# Patient Record
Sex: Male | Born: 1975 | Race: White | Hispanic: No | Marital: Married | Smoking: Former smoker
Health system: Southern US, Community
[De-identification: ages and names within clinical notes are randomized; demographics above are authoritative.]

## PROBLEM LIST (undated history)

## (undated) DIAGNOSIS — F419 Anxiety disorder, unspecified: Secondary | ICD-10-CM

## (undated) DIAGNOSIS — Q221 Congenital pulmonary valve stenosis: Secondary | ICD-10-CM

## (undated) DIAGNOSIS — I255 Ischemic cardiomyopathy: Secondary | ICD-10-CM

## (undated) DIAGNOSIS — Z630 Problems in relationship with spouse or partner: Secondary | ICD-10-CM

## (undated) DIAGNOSIS — I10 Essential (primary) hypertension: Secondary | ICD-10-CM

## (undated) DIAGNOSIS — I493 Ventricular premature depolarization: Secondary | ICD-10-CM

## (undated) DIAGNOSIS — Z87891 Personal history of nicotine dependence: Secondary | ICD-10-CM

## (undated) DIAGNOSIS — R011 Cardiac murmur, unspecified: Secondary | ICD-10-CM

## (undated) DIAGNOSIS — F32A Depression, unspecified: Secondary | ICD-10-CM

## (undated) DIAGNOSIS — I2510 Atherosclerotic heart disease of native coronary artery without angina pectoris: Secondary | ICD-10-CM

## (undated) DIAGNOSIS — R079 Chest pain, unspecified: Secondary | ICD-10-CM

## (undated) HISTORY — DX: Problems in relationship with spouse or partner: Z63.0

## (undated) HISTORY — PX: OTHER SURGICAL HISTORY: SHX169

## (undated) HISTORY — DX: Congenital pulmonary valve stenosis: Q22.1

## (undated) HISTORY — PX: ANKLE SURGERY: SHX546

## (undated) HISTORY — DX: Chest pain, unspecified: R07.9

## (undated) HISTORY — DX: Personal history of nicotine dependence: Z87.891

## (undated) HISTORY — DX: Anxiety disorder, unspecified: F41.9

## (undated) HISTORY — DX: Ventricular premature depolarization: I49.3

## (undated) HISTORY — PX: HAND SURGERY: SHX662

## (undated) HISTORY — DX: Depression, unspecified: F32.A

## (undated) HISTORY — PX: INGUINAL HERNIA REPAIR: SHX194

## (undated) HISTORY — DX: Ischemic cardiomyopathy: I25.5

## (undated) HISTORY — DX: Atherosclerotic heart disease of native coronary artery without angina pectoris: I25.10

## (undated) HISTORY — DX: Cardiac murmur, unspecified: R01.1

---

## 2004-07-13 ENCOUNTER — Ambulatory Visit: Payer: Self-pay

## 2004-07-26 ENCOUNTER — Ambulatory Visit: Payer: Self-pay | Admitting: Cardiology

## 2008-11-17 ENCOUNTER — Telehealth (INDEPENDENT_AMBULATORY_CARE_PROVIDER_SITE_OTHER): Payer: Self-pay | Admitting: *Deleted

## 2009-01-24 ENCOUNTER — Telehealth (INDEPENDENT_AMBULATORY_CARE_PROVIDER_SITE_OTHER): Payer: Self-pay | Admitting: *Deleted

## 2009-02-13 ENCOUNTER — Telehealth (INDEPENDENT_AMBULATORY_CARE_PROVIDER_SITE_OTHER): Payer: Self-pay | Admitting: *Deleted

## 2011-02-04 ENCOUNTER — Telehealth: Payer: Self-pay | Admitting: Cardiology

## 2011-02-04 ENCOUNTER — Encounter: Payer: Self-pay | Admitting: *Deleted

## 2011-02-04 NOTE — Telephone Encounter (Signed)
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 He called c/o while raking leaves he developed a discomfort in his chest that radiated into his throat and left shoulder. He states he had a panic attack and then developed a headache. The discomfort went away when he stopped raking. He denies SOB or other symptoms since this episode. He occ smokes and has been under increased stress with his job. He is pain free at present. Will discuss with the DOD Deliah Goody

## 2011-02-04 NOTE — Telephone Encounter (Signed)
New problem Pt called and said he had some angina pain over the weekend. He would like to talk to you.

## 2011-02-04 NOTE — Telephone Encounter (Signed)
Paper chart and symptoms reviewed with dr ross(dod). Pt will come tomorrow at 2 pm to see dr wall Brandon Kemp

## 2011-02-05 ENCOUNTER — Ambulatory Visit (INDEPENDENT_AMBULATORY_CARE_PROVIDER_SITE_OTHER): Payer: Managed Care, Other (non HMO) | Admitting: Cardiology

## 2011-02-05 ENCOUNTER — Encounter: Payer: Self-pay | Admitting: Cardiology

## 2011-02-05 VITALS — BP 124/88 | HR 79 | Ht 71.0 in | Wt 213.0 lb

## 2011-02-05 DIAGNOSIS — Q2579 Other congenital malformations of pulmonary artery: Secondary | ICD-10-CM

## 2011-02-05 DIAGNOSIS — R0789 Other chest pain: Secondary | ICD-10-CM

## 2011-02-05 DIAGNOSIS — Q256 Stenosis of pulmonary artery: Secondary | ICD-10-CM

## 2011-02-05 DIAGNOSIS — Q221 Congenital pulmonary valve stenosis: Secondary | ICD-10-CM | POA: Insufficient documentation

## 2011-02-05 DIAGNOSIS — Q288 Other specified congenital malformations of circulatory system: Secondary | ICD-10-CM

## 2011-02-05 LAB — HEPATIC FUNCTION PANEL
ALT: 26 U/L (ref 0–53)
Bilirubin, Direct: 0.1 mg/dL (ref 0.0–0.3)
Total Bilirubin: 1.1 mg/dL (ref 0.3–1.2)
Total Protein: 6.8 g/dL (ref 6.0–8.3)

## 2011-02-05 LAB — LIPID PANEL
HDL: 45.4 mg/dL (ref >39.00)
Triglycerides: 151 mg/dL — ABNORMAL HIGH (ref 0.0–149.0)
VLDL: 30.2 mg/dL (ref 0.0–40.0)

## 2011-02-05 NOTE — Patient Instructions (Addendum)
Your physician recommends that you have lab work today cholesterol and liver panel.  We will call you with your results.  Your physician has requested that you have an echocardiogram. Echocardiography is a painless test that uses sound waves to create images of your heart. It provides your doctor with information about the size and shape of your heart and how well your heart's chambers and valves are working. This procedure takes approximately one hour. There are no restrictions for this procedure.

## 2011-02-05 NOTE — Assessment & Plan Note (Addendum)
This sounds very atypical for coronary or cardiac related discomfort. I do not feel this is related to his previous surgery as a child.  I reviewed the symptoms of angina with him. We'll check fasting lipids and LFTs. He is advised not  to smoke or to quit.

## 2011-02-05 NOTE — Assessment & Plan Note (Signed)
He is status post repair in  [place] At age 35. He is asymptomatic. Her EKG is abnormal with a suggestion of right ventricular hypertrophy. Will check 2-D echocardiogram structural followup. I do not feel is related to his chest discomfort.

## 2011-02-05 NOTE — Progress Notes (Signed)
HPI Brandon Kemp comes in today for her chest discomfort while raking leaves with his dad.  Scratches a well localized discomfort just medial to the left nipple. It was aching and then shooting in nature. It lasted for a few seconds. At one point it did  to the neck and jaw area. No other associated symptoms.  He denies any cough, hemoptysis, difficulty taking a deep breath, difficulty swallowing, or chest tightness or pressure with exertion.  He does smoke but not a lot. He is getting to get laid off and  is under a lot of stress.  Denies orthopnea, PND, edema, palpitations, presyncope, syncope, fever or chills.  Past Medical History  Diagnosis Date  . Pulmonary stenosis   . History of cardiac murmur     systolic    No current outpatient prescriptions on file.    No Active Allergies  Family History  Problem Relation Age of Onset  . Brandon Kemp Parkinson White syndrome Brother   . Hypertension Father     History   Social History  . Marital Status: Single    Spouse Name: N/A    Number of Children: N/A  . Years of Education: N/A   Occupational History  . Not on file.   Social History Main Topics  . Smoking status: Current Some Day Smoker  . Smokeless tobacco: Not on file   Comment: sometimes, when stressed about 2 cigarettes a day  . Alcohol Use: Yes     social  . Drug Use: No  . Sexually Active: Not on file   Other Topics Concern  . Not on file   Social History Narrative  . No narrative on file    ROS ALL NEGATIVE EXCEPT THOSE NOTED IN HPI  PE  General Appearance: well developed, well nourished in no acute distress HEENT: symmetrical face, PERRLA, good dentition  Neck: no JVD, thyromegaly, or adenopathy, trachea midline Chest: symmetric without deformity, no chest [name] tenderness Cardiac: PMI non-displaced, RRR, normal S1, S2, no gallop or murmur Lung: clear to ausculation and percussion Vascular: all pulses full without bruits  Abdominal: nondistended,  nontender, good bowel sounds, no HSM, no bruits Extremities: no cyanosis, clubbing or edema, no sign of DVT, no varicosities  Skin: normal color, no rashes Neuro: alert and oriented x 3, non-focal Pysch: normal affect  EKG In normal sinus rhythm right atrial enlargement, mild right ventricular hypertrophy BMET No results found for this basename: na, k, cl, co2, glucose, bun, creatinine, calcium, gfrnonaa, gfraa    Lipid Panel  No results found for this basename: chol, trig, hdl, cholhdl, vldl, ldlcalc    CBC No results found for this basename: wbc, rbc, hgb, hct, plt, mcv, mch, mchc, rdw, neutrabs, lymphsabs, monoabs, eosabs, basosabs

## 2011-02-06 ENCOUNTER — Ambulatory Visit (HOSPITAL_COMMUNITY): Payer: Managed Care, Other (non HMO) | Attending: Cardiovascular Disease | Admitting: Radiology

## 2011-02-06 DIAGNOSIS — Q256 Stenosis of pulmonary artery: Secondary | ICD-10-CM

## 2011-02-06 DIAGNOSIS — F17200 Nicotine dependence, unspecified, uncomplicated: Secondary | ICD-10-CM | POA: Insufficient documentation

## 2011-02-06 DIAGNOSIS — R072 Precordial pain: Secondary | ICD-10-CM | POA: Insufficient documentation

## 2011-02-06 LAB — LDL CHOLESTEROL, DIRECT: Direct LDL: 183.7 mg/dL

## 2011-02-07 ENCOUNTER — Telehealth: Payer: Self-pay | Admitting: *Deleted

## 2011-02-07 ENCOUNTER — Encounter: Payer: Self-pay | Admitting: *Deleted

## 2011-02-07 DIAGNOSIS — E785 Hyperlipidemia, unspecified: Secondary | ICD-10-CM

## 2011-02-07 NOTE — Telephone Encounter (Signed)
Pt aware of echo and cholesterol results. Will work on exercising and reducing saturated fats and follow a more heart healthy diet. Recommended AHA web site. Pt receptive. Repeat lipid, liver in 6 months per Dr. Daleen Squibb. Mylo Red RN

## 2011-02-07 NOTE — Telephone Encounter (Signed)
Message copied by Theda Belfast on Thu Feb 07, 2011 10:39 AM ------      Message from: Valera Castle C      Created: Thu Feb 07, 2011  9:40 AM       He needs to decrease saturated fat. LDL goal less then 160. Begin exercise to increase HDL. Repeat labs in 6 months to a year for possible drug therapy if this fails.

## 2011-05-24 ENCOUNTER — Telehealth: Payer: Self-pay | Admitting: Cardiology

## 2011-05-24 NOTE — Telephone Encounter (Signed)
Pt calls today re lab work due in June- fasting lipid and liver.  Also, would like Dr. 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 He would very much appreciate it if  Dr. Daleen Squibb were to give him a call at his convenience.   He would like Dr. Vern Claude opinion regarding interventional device contacts. Pt can be reached at [phone].   Mylo Red RN

## 2011-05-24 NOTE — Telephone Encounter (Signed)
New msg Pt wants to talk to you about something personal. Please call

## 2011-09-26 ENCOUNTER — Encounter: Payer: Self-pay | Admitting: Nurse Practitioner

## 2011-09-26 ENCOUNTER — Ambulatory Visit (INDEPENDENT_AMBULATORY_CARE_PROVIDER_SITE_OTHER): Payer: BC Managed Care – PPO | Admitting: Nurse Practitioner

## 2011-09-26 VITALS — BP 134/88 | HR 68 | Ht 71.0 in | Wt 219.0 lb

## 2011-09-26 DIAGNOSIS — I4949 Other premature depolarization: Secondary | ICD-10-CM

## 2011-09-26 DIAGNOSIS — E785 Hyperlipidemia, unspecified: Secondary | ICD-10-CM

## 2011-09-26 DIAGNOSIS — R002 Palpitations: Secondary | ICD-10-CM

## 2011-09-26 DIAGNOSIS — I493 Ventricular premature depolarization: Secondary | ICD-10-CM | POA: Insufficient documentation

## 2011-09-26 LAB — TSH: TSH: 1.36 u[IU]/mL (ref 0.35–5.50)

## 2011-09-26 LAB — LIPID PANEL
HDL: 43 mg/dL (ref >39.00)
Triglycerides: 120 mg/dL (ref 0.0–149.0)

## 2011-09-26 NOTE — Progress Notes (Signed)
Patient Name: Brandon Kemp Date of Encounter: 09/26/2011  Primary Care Provider:  Dwana Melena, MD Primary Cardiologist:  T. Wall, MD  Patient Profile  36 y/o male with h/o congenital pulmonic stenosis s/p repair who presents for f/u of c/p and palpitations.  Problem List   Past Medical History  Diagnosis Date  . Congenital pulmonic valve stenosis     a. s/p surgical repair @ Age 8 in [place];  b.  12/12  Echo: EF 55-60%, nl wall motion, mild LVH, moderate PR.  . Chest pain     a. 09/2011 ETT @ [facility]: Walked 13 mins, rare PVC's, couplet (symptomatic), no acute st/t changes.  . History of tobacco abuse   . Symptomatic PVCs     a. 09/2011   Past Surgical History  Procedure Date  . Pulmonary valvotomy age 48  . Pulmonary outflow patch age 48  . Hand surgery   . Ankle surgery     Allergies  No Known Allergies  HPI  36 y/o male with the above problem list.  He was last seen in the office in Dec 2012.  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  He had mild chest pain associated with this but no dyspnea or presyncope.  He was taken to the ED by IR staff and there he w/u was unrevealing, though his bp was elevated into the 170's per his report.  He saw cardiology and underwent ETT the next day as an outpt, which was normal as outlined above.  He did have rare PVC's during the ETT, which were symptomatic.  Since, he has had no recurrence of palpitations.  He has been watching his diet a little more closely and also checking his BP, noting that it has been running in the 130's.  Home Medications  Prior to Admission medications   Not on File    Review of Systems  Palpitations as above.  No chest pain, sob, n, v, dizziness, syncope, edema, early satiety, dysuria, dark stools, blood in stools, diarrhea, rash/skin changes, fevers, chills, wt loss/gain.   Otherwise all systems reviewed and negative.  Physical Exam  Blood pressure 134/88, pulse 68, height 5\' 11"  (1.803 m), weight 219 lb (99.338 kg).  General: Pleasant, NAD Psych: Normal affect. Neuro: Alert and oriented X 3. Moves all extremities spontaneously. HEENT: Normal  Neck: Supple without bruits or JVD. Lungs:  Resp regular and unlabored, CTA. Heart: RRR no s3, s4, or murmurs. Abdomen: Soft, non-tender, non-distended, BS + x 4.  Extremities: No clubbing, cyanosis or edema. DP/PT/Radials 2+ and equal bilaterally.  Accessory Clinical Findings  ECG - rsr, 68, rvh  Assessment & Plan  1.  Chest pain & symptomatic PVC's:  Resolved.  He believes he had a full battery of blood work in the ER, which was unrevealing but doesn't think he had a TSH.  We will check this today.  Given no recurrence of Ss, would not place on a BB @ this time but would consider this if he becomes more symptomatic in the future.  He showed excellent exercise tolerance on ETT and he does not require additional ischemic evaluation @ this time.  2.  Congenital Pulmonic Stenosis:  Last echo in 12/12 showed moderate PR.  Plan to f/u echo in December.  3.  HTN:  bp was high in ED but relatively  stable in the 130's since.  Rec continued following of bp @ home with ongoing lifestyle changes.  4.  HL:  Pt had nonfasting lipid profile in December with markedly elevated lipids.  Will repeat fasting profile today.  If high, would again rec aggressive lifestyle changes before meds, as he endorses a terrible diet.  Nicolasa Ducking, NP 09/26/2011, 10:29 AM

## 2011-09-26 NOTE — Patient Instructions (Addendum)
Please have blood work done today.  We will call you with these results. TSH;FLP  Your physician wants you to follow-up in: 5 months with Dr. Daleen Squibb.   You will receive a reminder letter in the mail two months in advance. If you don't receive a letter, please call our office to schedule the follow-up appointment.

## 2011-09-27 ENCOUNTER — Telehealth: Payer: Self-pay | Admitting: *Deleted

## 2011-09-27 NOTE — Telephone Encounter (Signed)
Message copied by Awilda Bill on Fri Sep 27, 2011  9:58 AM ------      Message from: Nicolasa Ducking R      Created: Thu Sep 26, 2011  5:52 PM       Fasting lipids look better than last years nonfasting.  TC and LDL still elevated (220/155) but at this point, given age, would rec aggressive lifestyle changes, i.e. Regular exercise and low fat/low cholesterol diet.  Repeat fasting lipids in 6 months.

## 2011-09-27 NOTE — Telephone Encounter (Signed)
Message copied by Awilda Bill on Fri Sep 27, 2011  9:51 AM ------      Message from: Nicolasa Ducking R      Created: Thu Sep 26, 2011  5:52 PM       Fasting lipids look better than last years nonfasting.  TC and LDL still elevated (220/155) but at this point, given age, would rec aggressive lifestyle changes, i.e. Regular exercise and low fat/low cholesterol diet.  Repeat fasting lipids in 6 months.

## 2011-09-27 NOTE — Telephone Encounter (Signed)
Spoke to patient regarding lab results.  Patient had questions about whether or not this was an indication for his headaches.  Not typical for this.  Pt is following up with his primary care, and will monitor his diet and try to get more exercise.  Vista Mink, CMA

## 2011-12-26 ENCOUNTER — Encounter (HOSPITAL_COMMUNITY): Payer: Self-pay | Admitting: Emergency Medicine

## 2011-12-26 ENCOUNTER — Emergency Department (HOSPITAL_COMMUNITY)
Admission: EM | Admit: 2011-12-26 | Discharge: 2011-12-27 | Disposition: A | Discharge status: Home / Self Care | Payer: Private Health Insurance - Indemnity | Attending: Emergency Medicine | Admitting: Emergency Medicine

## 2011-12-26 ENCOUNTER — Emergency Department (HOSPITAL_COMMUNITY): Payer: Private Health Insurance - Indemnity

## 2011-12-26 DIAGNOSIS — R0789 Other chest pain: Secondary | ICD-10-CM

## 2011-12-26 DIAGNOSIS — I4949 Other premature depolarization: Secondary | ICD-10-CM | POA: Insufficient documentation

## 2011-12-26 DIAGNOSIS — Z87891 Personal history of nicotine dependence: Secondary | ICD-10-CM | POA: Insufficient documentation

## 2011-12-26 DIAGNOSIS — N289 Disorder of kidney and ureter, unspecified: Secondary | ICD-10-CM | POA: Insufficient documentation

## 2011-12-26 DIAGNOSIS — Q221 Congenital pulmonary valve stenosis: Secondary | ICD-10-CM | POA: Insufficient documentation

## 2011-12-26 LAB — URINALYSIS, ROUTINE W REFLEX MICROSCOPIC
Glucose, UA: NEGATIVE mg/dL
Leukocytes, UA: NEGATIVE
Nitrite: NEGATIVE
Protein, ur: NEGATIVE mg/dL
Urobilinogen, UA: 0.2 mg/dL (ref 0.0–1.0)

## 2011-12-26 LAB — CBC WITH DIFFERENTIAL/PLATELET
Basophils Absolute: 0 10*3/uL (ref 0.0–0.1)
HCT: 44.9 % (ref 39.0–52.0)
Lymphocytes Relative: 35 % (ref 12–46)
Monocytes Absolute: 0.6 10*3/uL (ref 0.1–1.0)
Neutro Abs: 4 10*3/uL (ref 1.7–7.7)
RDW: 12.1 % (ref 11.5–15.5)
WBC: 7.3 10*3/uL (ref 4.0–10.5)

## 2011-12-26 LAB — POCT I-STAT TROPONIN I: Troponin i, poc: 0.01 ng/mL (ref 0.00–0.08)

## 2011-12-26 LAB — COMPREHENSIVE METABOLIC PANEL
Alkaline Phosphatase: 58 U/L (ref 39–117)
BUN: 18 mg/dL (ref 6–23)
Calcium: 10 mg/dL (ref 8.4–10.5)
GFR calc Af Amer: 47 mL/min — ABNORMAL LOW (ref >90)
Glucose, Bld: 118 mg/dL — ABNORMAL HIGH (ref 70–99)
Total Protein: 7.2 g/dL (ref 6.0–8.3)

## 2011-12-26 MED ORDER — SODIUM CHLORIDE 0.9 % IV BOLUS (SEPSIS)
1000.0000 mL | Freq: Once | INTRAVENOUS | Status: AC
  Administered 2011-12-26: 1000 mL via INTRAVENOUS

## 2011-12-26 MED ORDER — ALUM & MAG HYDROXIDE-SIMETH 200-200-20 MG/5ML PO SUSP
30.0000 mL | Freq: Once | ORAL | Status: AC
  Administered 2011-12-26: 30 mL via ORAL
  Filled 2011-12-26: qty 30

## 2011-12-26 NOTE — ED Notes (Signed)
PT. REPORTS MID CHEST PRESSURE / PALPITATIONS " DISCOMFORT" ONSET YESTERDAY , STATES " STRESSED OUT " FOR THE PAST SEVERAL DAYS .

## 2011-12-26 NOTE — ED Provider Notes (Signed)
History     CSN: [id number]  Arrival date & time 12/26/11  2122   First MD Initiated Contact with Patient 12/26/11 2146      Chief Complaint  Patient presents with  . Chest Pain    (Consider location/radiation/quality/duration/timing/severity/associated sxs/prior treatment) Patient is a 36 y.o. male presenting with chest pain. The history is provided by the patient. No language interpreter was used.  Chest Pain The chest pain began 3 - 5 days ago. Duration of episode(s) is 1 minute. Chest pain occurs intermittently. The chest pain is resolved. At its most intense, the pain is at 3/10. The pain is currently at 0/10. The severity of the pain is mild. The quality of the pain is described as tightness. The pain does not radiate. Chest pain is worsened by stress. Primary symptoms include palpitations (intermittent for several months). Pertinent negatives for primary symptoms include no shortness of breath, no cough, no abdominal pain, no nausea, no vomiting and no dizziness.  The palpitations did not occur with dizziness or shortness of breath.  Pertinent negatives for associated symptoms include no diaphoresis, no lower extremity edema and no near-syncope. He tried nothing for the symptoms. Risk factors include male gender.  His past medical history is significant for hypertension.  Pertinent negatives for past medical history include no CAD, no cancer, no MI and no PE.  Pertinent negatives for family medical history include: no early MI in family.  Procedure history is positive for echocardiogram and stress echo (neg in July '13).  Procedure history is negative for cardiac catheterization.     Past Medical History  Diagnosis Date  . Congenital pulmonic valve stenosis     a. s/p surgical repair @ Age 16 in [place];  b.  12/12  Echo: EF 55-60%, nl wall motion, mild LVH, moderate PR.  . Chest pain     a. 09/2011 ETT @ [facility]: Walked 13 mins, rare PVC's, couplet (symptomatic), no acute st/t  changes.  . History of tobacco abuse   . Symptomatic PVCs     a. 09/2011    Past Surgical History  Procedure Date  . Pulmonary valvotomy age 75  . Pulmonary outflow patch age 75  . Hand surgery   . Ankle surgery     Family History  Problem Relation Age of Onset  . Evelene Croon Parkinson White syndrome Brother   . Hypertension Father     History  Substance Use Topics  . Smoking status: Former Games developer  . Smokeless tobacco: Not on file   Comment: sometimes, when stressed about 2 cigarettes a day  . Alcohol Use: Yes     social      Review of Systems  Constitutional: Negative for diaphoresis, activity change and appetite change.  HENT: Negative for sore throat and neck pain.   Eyes: Negative for discharge and visual disturbance.  Respiratory: Negative for cough, choking and shortness of breath.   Cardiovascular: Positive for chest pain and palpitations (intermittent for several months). Negative for leg swelling and near-syncope.  Gastrointestinal: Negative for nausea, vomiting, abdominal pain, diarrhea and constipation.  Genitourinary: Negative for dysuria and difficulty urinating.  Musculoskeletal: Negative for back pain and arthralgias.  Skin: Negative for color change and pallor.  Neurological: Negative for dizziness, speech difficulty and light-headedness.  Psychiatric/Behavioral: Negative for behavioral problems and agitation.  All other systems reviewed and are negative.    Allergies  Review of patient's allergies indicates no known allergies.  Home Medications  No current outpatient prescriptions on file.  BP 163/100  Pulse 73  Temp 98.4 F (36.9 C) (Oral)  Resp 20  SpO2 99%  Physical Exam  Constitutional: He appears well-developed. No distress.  HENT:  Head: Normocephalic and atraumatic.  Mouth/Throat: No oropharyngeal exudate.  Eyes: EOM are normal. Pupils are equal, round, and reactive to light. Right eye exhibits no discharge. Left eye exhibits no  discharge.  Neck: Normal range of motion. Neck supple. No JVD present.  Cardiovascular: Normal rate, regular rhythm and normal heart sounds.   Pulmonary/Chest: Effort normal and breath sounds normal. No stridor. No respiratory distress. He has no wheezes. He has no rales. He exhibits no tenderness.  Abdominal: Soft. Bowel sounds are normal. There is no tenderness. There is no guarding.  Genitourinary: Penis normal.  Musculoskeletal: Normal range of motion. He exhibits no edema and no tenderness.  Neurological: He is alert. No cranial nerve deficit. He exhibits normal muscle tone.  Skin: Skin is warm and dry. He is not diaphoretic. No erythema. No pallor.  Psychiatric: He has a normal mood and affect. His behavior is normal. Judgment and thought content normal.    ED Course  Procedures (including critical care time)   Labs Reviewed  COMPREHENSIVE METABOLIC PANEL  CBC WITH DIFFERENTIAL   No results found.   No diagnosis found.    MDM   Comprehensive metabolic panel (Final result)  Abnormal  Component (Lab Inquiry)      Result Time  NA  K  CL  CO2  GLUCOSE    12/26/11 2230  137  3.9  98  29  118 (H)           Result Time  BUN  Creatinine, Ser  CALCIUM  PROTEIN  Albumin    12/26/11 2230  18  2.04 (H)  10.0  7.2  4.2           Result Time  AST  ALT  ALK PHOS  BILI TOTL  GFR calc non Af Amer    12/26/11 2230  23  22  58  0.5  40 (L)           Result Time  GFR calc Af Amer    12/26/11 2230  47 (L) The eGFR has been calculated using the CKD EPI equation. This calculation has not been validated in all clinical situations. eGFR's persistently <90 mL/min signify possible Chronic Kidney Disease.         POCT i-Stat troponin I (Final result)   Component (Lab Inquiry)      Result Time  Troponin i, poc  Comment 3    12/26/11 2207  0.01   Due to the release kinetics of cTnI, a negative result within the first hours of the onset of symptoms does not rule out myocardial  infarction with certainty. If myocardial infarction is still suspected, repeat the test at appropriate intervals.         CBC with Differential (Final result)   Component (Lab Inquiry)      Result Time  WBC  RBC  HGB  HCT  MCV    12/26/11 2207  7.3  5.08  15.9  44.9  88.4           Result Time  MCH  MCHC  RDW  PLT  NEUTRO PCT    12/26/11 2207  31.3  35.4  12.1  209  55           Result Time  AB NEUTRO  LYMPHO PCT  AB  LYM  MONO PCT  MONO ABS    12/26/11 2207  4.0  35  2.6  8  0.6           Result Time  EOS PCT  EOSINO ABS  BASOS PCT  BASOS ABS    12/26/11 2207  2  0.1  0  0.0          Imaging Results         DG Chest 2 View (Final result)   Result time:12/26/11 2229    Final result by Rad Results In Interface (12/26/11 22:29:49)    Narrative:   *RADIOLOGY REPORT*  Clinical Data: Left-sided chest pain  CHEST - 2 VIEW  Comparison: None.  Findings: Lungs are clear. No pleural effusion or pneumothorax. The cardiomediastinal contours are within normal limits. The visualized bones and soft tissues are without significant appreciable abnormality.  IMPRESSION: No radiographic evidence of acute cardiopulmonary process.   Original Report Authenticated By: Waneta Martins, M.D.      Hx pulm valve stenosis repaired at age of 2, no problems since then. Also had neg ESE in July at [place]. Today he p/w 4d interminttent Chest tightness. Refuses to call it pain, but at worst it was 3/10 discomfort. No pain or discomfort currently.  11:40 PM initial Tn neg, will repeat at 0100, if nml will DC. elev Cr noted, instructed on f/u for this, may be related to HTN. Has appt Monday w/ pcp. Stable, asymptomatic.        Warrick Parisian, MD 12/27/11 [phone]

## 2011-12-26 NOTE — ED Notes (Signed)
Patient transported to X-ray 

## 2011-12-27 LAB — TROPONIN I: Troponin I: <0.3 ng/mL (ref <0.30)

## 2011-12-27 NOTE — ED Provider Notes (Signed)
I have supervised the resident on the management of this patient and agree with the note above. I personally interviewed and examined the patient and my addendum is below.   Brandon Kemp is a 36 y.o. male hx of congential pulmonic valve stenosis here with chest pain. Substernal chest discomfort for 3 days, no SOB or fever. Pain free on arrival. EKG unchanged. Trop neg x 1. CXR nl. No concern for PE. He has Cr 2.0, unclear baseline. UA nl. He will f/u outpatient regarding his elevated Cr. I signed out to Dr. Leone Haven to follow up the delta troponin.     Richardean Canal, MD 12/27/11 1700

## 2011-12-30 ENCOUNTER — Encounter: Payer: Self-pay | Admitting: Cardiovascular Disease

## 2011-12-30 ENCOUNTER — Ambulatory Visit (INDEPENDENT_AMBULATORY_CARE_PROVIDER_SITE_OTHER): Payer: Private Health Insurance - Indemnity | Admitting: Cardiovascular Disease

## 2011-12-30 VITALS — BP 130/100 | HR 97 | Ht 71.0 in | Wt 212.0 lb

## 2011-12-30 DIAGNOSIS — R002 Palpitations: Secondary | ICD-10-CM

## 2011-12-30 DIAGNOSIS — I10 Essential (primary) hypertension: Secondary | ICD-10-CM

## 2011-12-30 MED ORDER — METOPROLOL SUCCINATE ER 25 MG PO TB24: 25.0000 mg | ORAL_TABLET | Freq: Every day | ORAL | Status: DC

## 2011-12-30 NOTE — Patient Instructions (Addendum)
Your physician recommends that you schedule a follow-up appointment in: 4-6 WEEKS with Dr Excell Seltzer  Your physician has recommended that you wear a 24 hour holter monitor. Holter monitors are medical devices that record the heart's electrical activity. Doctors most often use these monitors to diagnose arrhythmias. Arrhythmias are problems with the speed or rhythm of the heartbeat. The monitor is a small, portable device. You can wear one while you do your normal daily activities. This is usually used to diagnose what is causing palpitations/syncope (passing out).  Your physician has requested that you have a renal artery duplex. During this test, an ultrasound is used to evaluate blood flow to the kidneys. Allow one hour for this exam. Do not eat after midnight the day before and avoid carbonated beverages. Take your medications as you usually do.  Your physician has recommended you make the following change in your medication: START Metoprolol Succinate 25mg  take one by mouth daily

## 2011-12-30 NOTE — Progress Notes (Signed)
HPI:  36 year old gentleman presenting for followup evaluation. 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 He's had an echo within the last year demonstrating left ventricular ejection fraction of 55-60% with normal wall motion, mild left ventricular hypertrophy, and moderate pulmonic regurgitation. The patient has been evaluated for chest pain in the past, most recently with an exercise treadmill study in July 2013 demonstrating no significant ST or T-wave changes. He walked 13 minutes according to the Bruce protocol with rare PVCs.  The patient complains of frequent palpitations. He's been under a great deal of stress recently. He notes that his blood pressure has been consistently elevated greater than 140/90. He's had chest pain and multiple focal areas around the upper abdomen and right and left chest. He denies dyspnea with exertion, leg swelling, orthopnea, PND, lightheadedness, or syncope.  Outpatient Encounter Prescriptions as of 12/30/2011  Medication Sig Dispense Refill  . diphenhydrAMINE (BENADRYL) 25 MG tablet Take 25 mg by mouth every 6 (six) hours as needed. For allergies      . GuaiFENesin (MUCINEX PO) Take by mouth as needed.      Marland Kitchen ibuprofen (ADVIL,MOTRIN) 200 MG tablet Take 400 mg by mouth every 6 (six) hours as needed. For pain        No Known Allergies  Past Medical History  Diagnosis Date  . Congenital pulmonic valve stenosis     a. s/p surgical repair @ Age 60 in [place];  b.  12/12  Echo: EF 55-60%, nl wall motion, mild LVH, moderate PR.  . Chest pain     a. 09/2011 ETT @ [facility]: Walked 13 mins, rare PVC's, couplet (symptomatic), no acute st/t changes.  . History of tobacco abuse   . Symptomatic PVCs     a. 09/2011    ROS: Negative except as per HPI  BP 130/100  Pulse 97  Ht 5\' 11"  (1.803 m)  Wt 96.163 kg (212  lb)  BMI 29.57 kg/m2  SpO2 98%  PHYSICAL EXAM: Pt is alert and oriented, NAD HEENT: normal Neck: JVP - normal, carotids 2+= without bruits Lungs: CTA bilaterally CV: RRR with a grade 2/6 early systolic murmur at the LUSB. No diastolic murmur. Abd: soft, NT, Positive BS, no hepatomegaly Ext: no C/C/E, distal pulses intact and equal Skin: warm/dry no rash  EKG:  NSR 92 bpm, biatrial enlargement.  2D ECHO: 02/2011: Left ventricle: The cavity size was normal. Wall thickness was increased in a pattern of mild LVH. Systolic function was normal. The estimated ejection fraction was in the range of 55% to 60%. Wall motion was normal; there were no regional wall motion abnormalities. The transmitral flow pattern was normal. The deceleration time of the early transmitral flow velocity was normal. The pulmonary vein flow pattern was normal. The tissue Doppler parameters were normal. Left ventricular diastolic function parameters were normal.  ------------------------------------------------------------ Aortic valve: Structurally normal valve. Trileaflet. Cusp separation was normal. Doppler: Transvalvular velocity was within the normal range. There was no stenosis. No regurgitation.  ------------------------------------------------------------ Aorta: The aorta was normal, not dilated, and non-diseased.  ------------------------------------------------------------ Mitral valve: Structurally normal valve. Leaflet separation was normal. Doppler: Transvalvular velocity was within the normal range. There was no evidence for stenosis. Trivial regurgitation. Peak gradient: 3mm Hg (D).  ------------------------------------------------------------ Left atrium: The atrium was normal in size.  ------------------------------------------------------------  Atrial septum: No defect or patent foramen ovale was identified.  ------------------------------------------------------------ Right  ventricle: The cavity size was normal. Wall thickness was normal. Systolic function was normal.  ------------------------------------------------------------ Pulmonic valve: Normal-sized annulus. Prior repair procedures included balloon valvuloplasty. Doppler: Moderate regurgitation.  ------------------------------------------------------------ Tricuspid valve: Structurally normal valve. Leaflet separation was normal. Doppler: Transvalvular velocity was within the normal range. Trivial regurgitation.  ------------------------------------------------------------ Pulmonary artery: The main pulmonary artery was normal-sized.  ------------------------------------------------------------ Right atrium: The atrium was normal in size.  ------------------------------------------------------------ Pericardium: The pericardium was normal in appearance.  ------------------------------------------------------------ Systemic veins: Inferior vena cava: The vessel was normal in size; the respirophasic diameter changes were in the normal range (= 50%); findings are consistent with normal central venous pressure.  ASSESSMENT AND PLAN: 1. Hypertension. The patient's blood pressure is consistently elevated and I think he needs medical treatment. Considering symptomatic palpitations, I have recommended metoprolol succinate 25 mg daily as a first-line agent. He will followup in 4-6 weeks. I reviewed the patient's recent lab work and his creatinine is elevated. I encouraged him to push fluids and I think we should check a renal arterial duplex considering his hypertension. He should also have a repeat metabolic panel in the next several weeks.  2. Cardiac palpitations. Recommend a 24-hour Holter monitor. Beta blocker started as above.  [name] 12/30/2011 10:39 PM]

## 2012-01-07 ENCOUNTER — Encounter (INDEPENDENT_AMBULATORY_CARE_PROVIDER_SITE_OTHER): Payer: Private Health Insurance - Indemnity

## 2012-01-07 DIAGNOSIS — R0989 Other specified symptoms and signs involving the circulatory and respiratory systems: Secondary | ICD-10-CM

## 2012-01-07 DIAGNOSIS — R002 Palpitations: Secondary | ICD-10-CM

## 2012-01-07 DIAGNOSIS — I10 Essential (primary) hypertension: Secondary | ICD-10-CM

## 2012-01-20 ENCOUNTER — Encounter (INDEPENDENT_AMBULATORY_CARE_PROVIDER_SITE_OTHER): Payer: Private Health Insurance - Indemnity

## 2012-01-20 DIAGNOSIS — I10 Essential (primary) hypertension: Secondary | ICD-10-CM

## 2012-01-20 DIAGNOSIS — R002 Palpitations: Secondary | ICD-10-CM

## 2012-01-27 ENCOUNTER — Encounter (INDEPENDENT_AMBULATORY_CARE_PROVIDER_SITE_OTHER): Payer: Private Health Insurance - Indemnity

## 2012-01-27 DIAGNOSIS — R002 Palpitations: Secondary | ICD-10-CM

## 2012-01-27 NOTE — Progress Notes (Signed)
Patient ID: Brandon Kemp, male   DOB: [birth year], 36 y.o.   MRN: [id number] Placed a 24 hr monitor on 01/27/12 went over directions with patient and he will return on wednesday

## 2012-02-07 ENCOUNTER — Ambulatory Visit: Payer: Private Health Insurance - Indemnity | Admitting: Cardiovascular Disease

## 2012-03-27 ENCOUNTER — Ambulatory Visit (INDEPENDENT_AMBULATORY_CARE_PROVIDER_SITE_OTHER): Payer: Private Health Insurance - Indemnity | Admitting: Cardiovascular Disease

## 2012-03-27 ENCOUNTER — Encounter: Payer: Self-pay | Admitting: Cardiovascular Disease

## 2012-03-27 VITALS — BP 133/82 | HR 74 | Ht 70.0 in | Wt 219.8 lb

## 2012-03-27 DIAGNOSIS — R002 Palpitations: Secondary | ICD-10-CM

## 2012-03-27 NOTE — Patient Instructions (Addendum)
Your physician recommends that you continue on your current medications as directed. Please refer to the Current Medication list given to you today.  Your physician wants you to follow-up in: 1 year. You will receive a reminder letter in the mail two months in advance. If you don't receive a letter, please call our office to schedule the follow-up appointment.  

## 2012-03-27 NOTE — Progress Notes (Signed)
   HPI:  37 year old gentleman presenting for followup evaluation. The patient has a history of congenital pulmonic stenosis and underwent surgical repair at age 73. An echocardiogram last year showed normal left ventricular function, mild LVH, and moderate pulmonic regurgitation without significant pulmonic stenosis. He's undergone exercise stress testing that showed good exercise tolerance without significant ST or T-wave changes. Because of frequent palpitations, he underwent a Holter monitor showing rare PVCs. There is no sustained arrhythmias or significant pauses. He's also followed for essential hypertension.  He's doing well at the present time. He denies chest pain, palpitations, edema, lightheadedness, or syncope. He's had some problems with numbness in the right foot. He's been exercising sporadically without exertional symptoms. He has no other complaints at this time.  Outpatient Encounter Prescriptions as of 03/27/2012  Medication Sig Dispense Refill  . diphenhydrAMINE (BENADRYL) 25 MG tablet Take 25 mg by mouth every 6 (six) hours as needed. For allergies      . FINASTERIDE PO Take by mouth daily.      . GuaiFENesin (MUCINEX PO) Take by mouth as needed.      Marland Kitchen ibuprofen (ADVIL,MOTRIN) 200 MG tablet Take 400 mg by mouth every 6 (six) hours as needed. For pain      . metoprolol succinate (TOPROL-XL) 25 MG 24 hr tablet Take 1 tablet (25 mg total) by mouth daily.  30 tablet  11    No Known Allergies  Past Medical History  Diagnosis Date  . Congenital pulmonic valve stenosis     a. s/p surgical repair @ Age 78 in [place];  b.  12/12  Echo: EF 55-60%, nl wall motion, mild LVH, moderate PR.  . Chest pain     a. 09/2011 ETT @ [facility]: Walked 13 mins, rare PVC's, couplet (symptomatic), no acute st/t changes.  . History of tobacco abuse   . Symptomatic PVCs     a. 09/2011    ROS: Negative except as per HPI  BP 133/82  Pulse 74  Ht 5\' 10"  (1.778 m)  Wt 99.701 kg (219 lb 12.8 oz)  BMI  31.54 kg/m2  PHYSICAL EXAM: Pt is alert and oriented, NAD HEENT: normal Neck: JVP - normal, carotids 2+= without bruits Lungs: CTA bilaterally CV: RRR without murmur or gallop Abd: soft, NT, Positive BS, no hepatomegaly Ext: no C/C/E, distal pulses intact and equal Skin: warm/dry no rash  ASSESSMENT AND PLAN: 1. Congenital pulmonic stenosis status post surgical repair. Only moderate pulmonic regurgitation present. The patient is asymptomatic from a cardiac perspective. Continue routine followup in 1 year.  2. Essential hypertension. He will remain on metoprolol succinate at bedtime. Blood pressure is controlled. Regular exercise was encouraged.  3. Palpitations. These have resolved. I suspect a lot of this was stress related.  For followup I will see him back in one year.  Brandon Kemp 03/27/2012 10:40 AM

## 2012-04-21 ENCOUNTER — Encounter (HOSPITAL_COMMUNITY): Payer: Self-pay | Admitting: Emergency Medicine

## 2012-04-21 ENCOUNTER — Emergency Department (HOSPITAL_COMMUNITY)
Admission: EM | Admit: 2012-04-21 | Discharge: 2012-04-22 | Disposition: A | Discharge status: Home / Self Care | Payer: Private Health Insurance - Indemnity | Attending: Emergency Medicine | Admitting: Emergency Medicine

## 2012-04-21 DIAGNOSIS — R002 Palpitations: Secondary | ICD-10-CM

## 2012-04-21 DIAGNOSIS — Z8774 Personal history of (corrected) congenital malformations of heart and circulatory system: Secondary | ICD-10-CM | POA: Insufficient documentation

## 2012-04-21 DIAGNOSIS — Z8679 Personal history of other diseases of the circulatory system: Secondary | ICD-10-CM | POA: Insufficient documentation

## 2012-04-21 DIAGNOSIS — I10 Essential (primary) hypertension: Secondary | ICD-10-CM | POA: Insufficient documentation

## 2012-04-21 DIAGNOSIS — Z79899 Other long term (current) drug therapy: Secondary | ICD-10-CM | POA: Insufficient documentation

## 2012-04-21 DIAGNOSIS — Z87891 Personal history of nicotine dependence: Secondary | ICD-10-CM | POA: Insufficient documentation

## 2012-04-21 DIAGNOSIS — R0789 Other chest pain: Secondary | ICD-10-CM | POA: Insufficient documentation

## 2012-04-21 HISTORY — DX: Essential (primary) hypertension: I10

## 2012-04-21 MED ORDER — METOPROLOL TARTRATE 1 MG/ML IV SOLN
5.0000 mg | Freq: Once | INTRAVENOUS | Status: AC
  Administered 2012-04-22: 5 mg via INTRAVENOUS
  Filled 2012-04-21: qty 5

## 2012-04-21 NOTE — ED Provider Notes (Addendum)
History     CSN: [id number]  Arrival date & time 04/21/12  2331   First MD Initiated Contact with Patient 04/21/12 2332      Chief Complaint  Patient presents with  . Palpitations    (Consider location/radiation/quality/duration/timing/severity/associated sxs/prior treatment) Patient is a 37 y.o. male presenting with palpitations. The history is provided by the patient.  Palpitations  This is a new problem. The current episode started 1 to 2 hours ago. The problem occurs constantly. The problem has been resolved. Associated with: pt staets has been off of his metoprolol x 2 day because he missed it. On average, each episode lasts 2 hours. Associated symptoms include chest pressure. He has tried nothing for the symptoms. The treatment provided no relief. There are no known risk factors. His past medical history is significant for valve disorder.    Past Medical History  Diagnosis Date  . Congenital pulmonic valve stenosis     a. s/p surgical repair @ Age 21 in [place];  b.  12/12  Echo: EF 55-60%, nl wall motion, mild LVH, moderate PR.  . Chest pain     a. 09/2011 ETT @ [facility]: Walked 13 mins, rare PVC's, couplet (symptomatic), no acute st/t changes.  . History of tobacco abuse   . Symptomatic PVCs     a. 09/2011  . Hypertension     Past Surgical History  Procedure Laterality Date  . Pulmonary valvotomy  age 44  . Pulmonary outflow patch  age 44  . Hand surgery    . Ankle surgery      Family History  Problem Relation Age of Onset  . Evelene Croon Parkinson White syndrome Brother   . Hypertension Father     History  Substance Use Topics  . Smoking status: Former Games developer  . Smokeless tobacco: Not on file     Comment: sometimes, when stressed about 2 cigarettes a day  . Alcohol Use: Yes     Comment: social      Review of Systems  Cardiovascular: Positive for palpitations.  All other systems reviewed and are negative.    Allergies  Review of patient's allergies indicates  no known allergies.  Home Medications   Current Outpatient Rx  Name  Route  Sig  Dispense  Refill  . diphenhydrAMINE (BENADRYL) 25 MG tablet   Oral   Take 25 mg by mouth every 6 (six) hours as needed. For allergies         . FINASTERIDE PO   Oral   Take by mouth daily.         . GuaiFENesin (MUCINEX PO)   Oral   Take by mouth as needed.         Marland Kitchen ibuprofen (ADVIL,MOTRIN) 200 MG tablet   Oral   Take 400 mg by mouth every 6 (six) hours as needed. For pain         . metoprolol succinate (TOPROL-XL) 25 MG 24 hr tablet   Oral   Take 1 tablet (25 mg total) by mouth daily.   30 tablet   11     BP 154/84  Pulse 86  Temp(Src) 98.6 F (37 C)  Resp 14  SpO2 99%  Physical Exam  Nursing note and vitals reviewed. Constitutional: He is oriented to person, place, and time. He appears well-developed and well-nourished.  HENT:  Head: Normocephalic and atraumatic.  Eyes: Conjunctivae are normal. Pupils are equal, round, and reactive to light.  Neck: Normal range of motion. Neck supple.  Cardiovascular: Normal rate, regular rhythm, normal heart sounds and intact distal pulses.   Pulmonary/Chest: Effort normal and breath sounds normal.  Abdominal: Soft. Bowel sounds are normal.  Neurological: He is alert and oriented to person, place, and time.  Skin: Skin is warm and dry.  Psychiatric: He has a normal mood and affect. His behavior is normal. Judgment and thought content normal.    ED Course  Procedures (including critical care time)  Labs Reviewed  CBC   No results found.   No diagnosis found.   Date: 04/21/2012  Rate: 86  Rhythm: normal sinus rhythm  QRS Axis: normal  Intervals: normal  ST/T Wave abnormalities: normal  Conduction Disutrbances: none  Narrative Interpretation: unremarkable     MDM  + palpitations.  Resolved in ed.  States has been out of his metoprolol x 2 days.  No ectopy noted.  Will lab,  Iv metoprolol,  reassess   Labs benign.   Will refill metoprolol,  Dc to fu,  Ret new/worsening sxs     Yolani Vo Lytle Michaels, MD 04/21/12 2359  Alicja Everitt Lytle Michaels, MD 04/22/12 [id number]

## 2012-04-21 NOTE — ED Notes (Signed)
[facility] presents with a 37 yo male from home having palpitations and irregular heart beats.  Pt stated to [facility] he was laying on couch when he started feeling palpitations in his chest.  Minimal pain reported to [facility]; pt checked heart rate and stated that it was irregular; pt took 650 mg of ASA. Pt has hx of HTN and open heart surgery at age of 2 and diagnosed with pulmonary artery stenosis around birth to age 43 (pt. Unsure).

## 2012-04-22 ENCOUNTER — Emergency Department (HOSPITAL_COMMUNITY): Payer: Private Health Insurance - Indemnity

## 2012-04-22 LAB — POCT I-STAT, CHEM 8
HCT: 47 % (ref 39.0–52.0)
Hemoglobin: 16 g/dL (ref 13.0–17.0)
Potassium: 3.9 mEq/L (ref 3.5–5.1)
Sodium: 141 mEq/L (ref 135–145)
TCO2: 31 mmol/L (ref 0–100)

## 2012-04-22 LAB — POCT I-STAT TROPONIN I: Troponin i, poc: 0.04 ng/mL (ref 0.00–0.08)

## 2012-04-22 LAB — CBC
MCHC: 34.1 g/dL (ref 30.0–36.0)
Platelets: 205 10*3/uL (ref 150–400)
RDW: 12.4 % (ref 11.5–15.5)

## 2012-04-22 MED ORDER — METOPROLOL TARTRATE 100 MG PO TABS: ORAL_TABLET | ORAL | Status: DC

## 2012-09-01 ENCOUNTER — Telehealth: Payer: Self-pay | Admitting: Cardiovascular Disease

## 2012-09-01 DIAGNOSIS — I10 Essential (primary) hypertension: Secondary | ICD-10-CM

## 2012-09-01 DIAGNOSIS — R002 Palpitations: Secondary | ICD-10-CM

## 2012-09-01 MED ORDER — METOPROLOL SUCCINATE ER 25 MG PO TB24: 25.0000 mg | ORAL_TABLET | Freq: Every day | ORAL | Status: DC

## 2012-09-01 NOTE — Telephone Encounter (Signed)
New Prob     Requesting a new prescription for METOPROLOL to [facility] on [address].

## 2012-09-01 NOTE — Telephone Encounter (Signed)
I spoke with the pt and he would actually like Rx for Metoprolol Succinate sent to [facility] as a 90 day supply. Rx sent.

## 2013-04-29 ENCOUNTER — Other Ambulatory Visit: Payer: Self-pay | Admitting: Cardiology

## 2013-04-29 ENCOUNTER — Telehealth: Payer: Self-pay | Admitting: Cardiology

## 2013-04-29 DIAGNOSIS — R002 Palpitations: Secondary | ICD-10-CM

## 2013-04-29 DIAGNOSIS — I10 Essential (primary) hypertension: Secondary | ICD-10-CM

## 2013-04-29 MED ORDER — METOPROLOL SUCCINATE ER 25 MG PO TB24: 25.0000 mg | ORAL_TABLET | Freq: Every day | ORAL | Status: DC

## 2013-04-29 NOTE — Telephone Encounter (Signed)
Pt called stating that he is out of town in [place] on a business trip, and left his Metoprolol at home. 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 He is concerned about his HR increasing and developing symptoms. He is requesting Rx to be sent to [facility]. I E-prescribed 25 mg of Toprol-XL to the [id number] [place].   Robbie LisBrittainy Syla Devoss, PA-C

## 2013-08-05 DIAGNOSIS — G43109 Migraine with aura, not intractable, without status migrainosus: Secondary | ICD-10-CM | POA: Insufficient documentation

## 2013-08-05 DIAGNOSIS — R251 Tremor, unspecified: Secondary | ICD-10-CM | POA: Insufficient documentation

## 2013-08-05 DIAGNOSIS — G5600 Carpal tunnel syndrome, unspecified upper limb: Secondary | ICD-10-CM | POA: Insufficient documentation

## 2013-08-05 DIAGNOSIS — M5136 Other intervertebral disc degeneration, lumbar region: Secondary | ICD-10-CM | POA: Insufficient documentation

## 2013-08-18 ENCOUNTER — Ambulatory Visit (INDEPENDENT_AMBULATORY_CARE_PROVIDER_SITE_OTHER): Payer: BC Managed Care – PPO | Admitting: Cardiovascular Disease

## 2013-08-18 ENCOUNTER — Encounter (INDEPENDENT_AMBULATORY_CARE_PROVIDER_SITE_OTHER): Payer: Self-pay

## 2013-08-18 ENCOUNTER — Encounter: Payer: Self-pay | Admitting: Cardiovascular Disease

## 2013-08-18 VITALS — BP 139/94 | HR 71 | Ht 70.0 in | Wt 212.0 lb

## 2013-08-18 DIAGNOSIS — I10 Essential (primary) hypertension: Secondary | ICD-10-CM

## 2013-08-18 DIAGNOSIS — R002 Palpitations: Secondary | ICD-10-CM

## 2013-08-18 NOTE — Patient Instructions (Signed)
Your physician recommends that you continue on your current medications as directed. Please refer to the Current Medication list given to you today.  Your physician has requested that you regularly monitor and record your blood pressure readings at home once a week. Please use the same machine at the same time of day to check your readings and record them to bring to your follow-up visit. Please call the office if your BP is consistently above 140/90.  Your physician wants you to follow-up in: 1 YEAR with Dr Excell Seltzerooper.  You will receive a reminder letter in the mail two months in advance. If you don't receive a letter, please call our office to schedule the follow-up appointment.

## 2013-08-19 ENCOUNTER — Encounter: Payer: Self-pay | Admitting: Cardiovascular Disease

## 2013-08-19 NOTE — Progress Notes (Signed)
    HPI:  38 year old gentleman presenting for followup evaluation. The patient has a history of congenital pulmonic stenosis and underwent surgical repair at age 89. An echocardiogram last year showed normal left ventricular function, mild LVH, and moderate pulmonic regurgitation without significant pulmonic stenosis. He's undergone exercise stress testing that showed good exercise tolerance without significant ST or T-wave changes. Because of frequent palpitations, he underwent a Holter monitor showing rare PVCs. There is no sustained arrhythmias or significant pauses. He's also followed for essential hypertension.  He's doing well without symptoms of chest pain, shortness of breath, edema, or recurrent heart palpitations. He's been exercising more regularly than in the past and has no symptoms with exertion.   Outpatient Encounter Prescriptions as of 08/18/2013  Medication Sig  . finasteride (PROPECIA) 1 MG tablet Take 1 mg by mouth daily.  . metoprolol succinate (TOPROL-XL) 25 MG 24 hr tablet Take 1 tablet (25 mg total) by mouth daily.    No Known Allergies  Past Medical History  Diagnosis Date  . Congenital pulmonic valve stenosis     a. s/p surgical repair @ Age 43 in [place];  b.  12/12  Echo: EF 55-60%, nl wall motion, mild LVH, moderate PR.  . Chest pain     a. 09/2011 ETT @ [facility]: Walked 13 mins, rare PVC's, couplet (symptomatic), no acute st/t changes.  . History of tobacco abuse   . Symptomatic PVCs     a. 09/2011  . Hypertension     ROS: Negative except as per HPI  BP 139/94  Pulse 71  Ht 5\' 10"  (1.778 m)  Wt 96.163 kg (212 lb)  BMI 30.42 kg/m2  PHYSICAL EXAM: Pt is alert and oriented, NAD HEENT: normal Neck: JVP - normal, carotids 2+= without bruits Lungs: CTA bilaterally CV: RRR without murmur or gallop Abd: soft, NT, Positive BS, no hepatomegaly Ext: no C/C/E, distal pulses intact and equal Skin: warm/dry no rash  EKG:  Normal sinus rhythm 73 beats per  minute, nonspecific T wave abnormality.  ASSESSMENT AND PLAN: 1. Congenital pulmonic stenosis status post surgical repair with moderate residual pulmonic regurgitation. The patient remains asymptomatic with normal RV and LV function. Continue routine clinical followup.  2. Essential hypertension. Blood pressure mildly elevated today. Advised him to monitor and call if readings consistently greater than 140/90. He was encouraged to continue with regular exercise and maintain a low-sodium diet.  Tonny BollmanMichael Cooper 08/19/2013 12:10 AM

## 2013-08-26 ENCOUNTER — Emergency Department (HOSPITAL_BASED_OUTPATIENT_CLINIC_OR_DEPARTMENT_OTHER)
Admission: EM | Admit: 2013-08-26 | Discharge: 2013-08-26 | Disposition: A | Discharge status: Home / Self Care | Payer: BC Managed Care – PPO | Attending: Emergency Medicine | Admitting: Emergency Medicine

## 2013-08-26 ENCOUNTER — Encounter (HOSPITAL_BASED_OUTPATIENT_CLINIC_OR_DEPARTMENT_OTHER): Payer: Self-pay | Admitting: Emergency Medicine

## 2013-08-26 DIAGNOSIS — Z79899 Other long term (current) drug therapy: Secondary | ICD-10-CM | POA: Insufficient documentation

## 2013-08-26 DIAGNOSIS — Z203 Contact with and (suspected) exposure to rabies: Secondary | ICD-10-CM | POA: Insufficient documentation

## 2013-08-26 DIAGNOSIS — I10 Essential (primary) hypertension: Secondary | ICD-10-CM | POA: Insufficient documentation

## 2013-08-26 DIAGNOSIS — Z87891 Personal history of nicotine dependence: Secondary | ICD-10-CM | POA: Insufficient documentation

## 2013-08-26 DIAGNOSIS — Z23 Encounter for immunization: Secondary | ICD-10-CM | POA: Insufficient documentation

## 2013-08-26 DIAGNOSIS — Q221 Congenital pulmonary valve stenosis: Secondary | ICD-10-CM | POA: Insufficient documentation

## 2013-08-26 MED ORDER — RABIES VACCINE, PCEC IM SUSR
1.0000 mL | Freq: Once | INTRAMUSCULAR | Status: AC
  Administered 2013-08-26: 1 mL via INTRAMUSCULAR
  Filled 2013-08-26: qty 1

## 2013-08-26 MED ORDER — RABIES IMMUNE GLOBULIN 150 UNIT/ML IM INJ
20.0000 [IU]/kg | INJECTION | Freq: Once | INTRAMUSCULAR | Status: AC
  Administered 2013-08-26: 1950 [IU] via INTRAMUSCULAR
  Filled 2013-08-26: qty 14

## 2013-08-26 NOTE — ED Provider Notes (Signed)
TIME SEEN: 3:35 PM  CHIEF COMPLAINT: Possible rabies exposure  HPI: Patient is a 38 yo M with history of HTN, pulmonic stenosis who presents to the emergency department after a possible rabies exposure. There was a bad found hanging behind a picture in his house 2 days ago. A friend of the family helped remove the bat. The family friend killed the bat and placed in the trash can. When they talked to animal control today, they stated because the bat was outside in a trash can in the heat, they did not feel they could use it for testing. They recommended family come to the emergency department for rabies immunoglobulin and vaccine. No known bite. Patient is feeling well and has no acute complaints. He is up-to-date on other vaccinations.  They're not sure how long that was in their house.   ROS: See HPI Constitutional: no fever  Eyes: no drainage  ENT: no runny nose   Cardiovascular:  no chest pain  Resp: no SOB  GI: no vomiting GU: no dysuria Integumentary: no rash  Allergy: no hives  Musculoskeletal: no leg swelling  Neurological: no slurred speech ROS otherwise negative  PAST MEDICAL HISTORY/PAST SURGICAL HISTORY:  Past Medical History  Diagnosis Date  . Congenital pulmonic valve stenosis     a. s/p surgical repair @ Age 89 in [place];  b.  12/12  Echo: EF 55-60%, nl wall motion, mild LVH, moderate PR.  . Chest pain     a. 09/2011 [facility]: Walked 13 mins, rare PVC's, couplet (symptomatic), no acute st/t changes.  . History of tobacco abuse   . Symptomatic PVCs     a. 09/2011  . Hypertension     MEDICATIONS:  Prior to Admission medications   Medication Sig Start Date End Date Taking? Authorizing Provider  finasteride (PROPECIA) 1 MG tablet Take 1 mg by mouth daily.    Historical Provider, MD  metoprolol succinate (TOPROL-XL) 25 MG 24 hr tablet Take 1 tablet (25 mg total) by mouth daily. 04/29/13   Brittainy Sharol HarnessSimmons, PA-C    ALLERGIES:  No Known Allergies  SOCIAL HISTORY:   History  Substance Use Topics  . Smoking status: Former Games developermoker  . Smokeless tobacco: Not on file     Comment: sometimes, when stressed about 2 cigarettes a day  . Alcohol Use: Yes     Comment: social    FAMILY HISTORY: Family History  Problem Relation Age of Onset  . Evelene CroonWolff Parkinson White syndrome Brother   . Hypertension Father     EXAM: BP 143/67  Pulse 72  Temp(Src) 98.3 F (36.8 C) (Oral)  Resp 16  Wt 214 lb 14.4 oz (97.478 kg)  SpO2 99% CONSTITUTIONAL: Alert and oriented and responds appropriately to questions. Well-appearing; well-nourished, nontoxic, pleasant, no distress HEAD: Normocephalic EYES: Conjunctivae clear, PERRL ENT: normal nose; no rhinorrhea; moist mucous membranes; pharynx without lesions noted NECK: Supple, no meningismus, no LAD  CARD: RRR; S1 and S2 appreciated; no murmurs, no clicks, no rubs, no gallops RESP: Normal chest excursion without splinting or tachypnea; breath sounds clear and equal bilaterally; no wheezes, no rhonchi, no rales,  ABD/GI: Normal bowel sounds; non-distended; soft, non-tender, no rebound, no guarding BACK:  The back appears normal and is non-tender to palpation, there is no CVA tenderness EXT: Normal ROM in all joints; non-tender to palpation; no edema; normal capillary refill; no cyanosis    SKIN: Normal color for age and race; warm NEURO: Moves all extremities equally, cranial nerves  II through XII intact, sensation to light-touch intact diffusely, normal gait PSYCH: The patient's mood and manner are appropriate. Grooming and personal hygiene are appropriate.  MEDICAL DECISION MAKING:  Patient here with possible rabies exposure. We'll give immunoglobulin and vaccine. We'll give the vaccine schedule and they will followup as an outpatient for further vaccinations. Have discussed supportive care instructions and return precautions. Family verbalize understanding and is comfortable plan.     Layla MawKristen N Ward, DO 08/26/13  1601

## 2013-08-26 NOTE — Discharge Instructions (Signed)
Rabies Vaccine   What You Need to Know   WHAT IS RABIES?   Rabies is a serious disease. It is caused by a virus.   Rabies is mainly a disease of animals. Humans get rabies when they are bitten by infected animals.   At first there might not be any symptoms. But weeks, or even years after a bite, rabies can cause pain, fatigue, headaches, fever, and irritability. These are followed by seizures, hallucinations, and paralysis. Human rabies is almost always fatal.   Wild animals, especially bats, are the most common source of human rabies infection in the [place]. Skunks, raccoons, dogs, cats, coyotes, foxes, and other mammals can also transmit the disease.   Human rabies is rare in the [place]. There have been only 55 cases diagnosed since 1990. However, between 16,000 and 39,000 people are vaccinated each year as a precaution after animal bites. Also, rabies is far more common in other parts of the world, with about 40,000 to 70,000 rabies-related deaths worldwide each year. Bites from unvaccinated dogs cause most of these cases.  Rabies vaccine can prevent rabies.   RABIES VACCINE   Rabies vaccine is given to people at high risk of rabies to protect them if they are exposed. It can also prevent the disease if it is given to a person after they have been exposed.   Rabies vaccine is made from killed rabies virus. It cannot cause rabies.  WHO SHOULD GET RABIES VACCINE AND WHEN?   Preventive Vaccination (No Exposure)   People at high risk of exposure to rabies, such as veterinarians, animal handlers, rabies laboratory workers, spelunkers, and rabies biologics production workers should be offered rabies vaccine.   The vaccine should also be considered for:   People whose activities bring them into frequent contact with rabies virus or with possibly rabid animals.   International travelers who are likely to come in contact with animals in parts of the world where rabies is common.  The pre-exposure schedule  for rabies vaccination is 3 doses, given at the following times:   Dose 1: As appropriate.   Dose 2: 7 days after Dose 1.   Dose 3: 21 days or 28 days after Dose 1.  For laboratory workers and others who may be repeatedly exposed to rabies virus, periodic testing for immunity is recommended and booster doses should be given as needed. (Testing or booster doses are not recommended for travelers). Ask your doctor for details.  Vaccination After an Exposure   Anyone who has been bitten by an animal, or who otherwise may have been exposed to rabies, should clean the wound and see a doctor immediately. The doctor will determine if they need to be vaccinated.   A person who is exposed and has never been vaccinated against rabies should get 4 doses of rabies vaccine: one dose right away and additional doses on the 3rd, 7th, and 14th days. They should also get another shot called Rabies Immune Globulin at the same time as the first dose.   A person who has been previously vaccinated should get 2 doses of rabies vaccine: one right away and another on the 3rd day. Rabies Immune Globulin is not needed.   TELL YOUR DOCTOR IF:   Talk with a doctor before getting rabies vaccine if you:   Ever had a serious (life-threatening) allergic reaction to a previous dose of rabies vaccine or to any component of the vaccine; tell your doctor if you have any   severe allergies.   Have a weakened immune system because of:   HIV, AIDS, or another disease that affects the immune system.   Treatment with drugs that affect the immune system, such as steroids.   Cancer or cancer treatment with radiation or drugs.  If you have a minor illness, such as a cold, you can be vaccinated. If you are moderately or severely ill, you should probably wait until you recover before getting a routine (non-exposure) dose of rabies vaccine.   If you have been exposed to rabies virus, you should get the vaccine regardless of any other illnesses you may have.   WHAT  ARE THE RISKS FROM RABIES VACCINE?   A vaccine, like any medicine, is capable of causing serious problems, such as severe allergic reactions. The risk of a vaccine causing serious harm, or death, is extremely small. Serious problems from rabies vaccine are very rare.   Mild problems:   Soreness, redness, swelling, or itching where the shot was given (30% to 74%).   Headache, nausea, abdominal pain, muscle aches, or dizziness (5% to 40%).  Moderate problems:   Hives, pain in the joints, or fever (about 6% of booster doses).   Other nervous system disorders, such as Guillain-Barré Syndrome (GBS), have been reported after rabies vaccine, but this happens so rarely that it is not known whether they are related to the vaccine.  Note: Several brands of rabies vaccine are available in the [place], and reactions may vary between brands. Your provider can give you more information about a particular brand.   WHAT IF THERE IS A SERIOUS REACTION?   What should I look for?   Look for anything that concerns you, such as signs of a severe allergic reaction, very high fever, or behavior changes.   Signs of a severe allergic reaction can include hives, swelling of the face and throat, difficulty breathing, a fast heartbeat, dizziness, and weakness. These would start a few minutes to a few hours after the vaccination.   What should I do?   If you think it is a severe allergic reaction or other emergency that cannot wait, call 911 or get the person to the nearest hospital. Otherwise, call your doctor.   Afterward, the reaction should be reported to the Vaccine Adverse Event Reporting System (VAERS). Your doctor might file this report, or you can do it yourself through the VAERS website at www.vaers.hhs.gov or by calling 1-800-822-7967.  VAERS is only for reporting reactions. They do not give medical advice.   HOW CAN I LEARN MORE?   Ask your doctor.   Call your local or state health department.   Contact the Centers for Disease  Control and Prevention (CDC):   Visit the CDC rabies website at www.cdc.gov/rabies/  CDC Rabies Vaccine VIS (12/08/07)   Document Released: 12/16/2005 Document Revised: 02/05/2012 Document Reviewed: 06/10/2012   ExitCare® Patient Information ©2015 [employer]. This information is not intended to replace advice given to you by your health care provider. Make sure you discuss any questions you have with your health care provider.

## 2013-08-26 NOTE — ED Notes (Signed)
Known bat in the house 2 days ago-sent by PCP for possible rabies startK

## 2013-08-29 ENCOUNTER — Emergency Department (INDEPENDENT_AMBULATORY_CARE_PROVIDER_SITE_OTHER)
Admission: EM | Admit: 2013-08-29 | Discharge: 2013-08-29 | Disposition: A | Discharge status: Home / Self Care | Payer: BC Managed Care – PPO | Source: Home / Self Care

## 2013-08-29 DIAGNOSIS — Z23 Encounter for immunization: Secondary | ICD-10-CM

## 2013-08-29 DIAGNOSIS — Z203 Contact with and (suspected) exposure to rabies: Secondary | ICD-10-CM

## 2013-08-29 MED ORDER — RABIES VACCINE, PCEC IM SUSR
1.0000 mL | Freq: Once | INTRAMUSCULAR | Status: AC
  Administered 2013-08-29: 1 mL via INTRAMUSCULAR

## 2013-08-29 NOTE — ED Notes (Signed)
Pt here for second rabies shot.  

## 2013-09-02 ENCOUNTER — Encounter: Payer: Self-pay | Admitting: Emergency Medicine

## 2013-09-02 ENCOUNTER — Emergency Department (INDEPENDENT_AMBULATORY_CARE_PROVIDER_SITE_OTHER)
Admission: EM | Admit: 2013-09-02 | Discharge: 2013-09-02 | Disposition: A | Discharge status: Home / Self Care | Payer: BC Managed Care – PPO | Source: Home / Self Care

## 2013-09-02 DIAGNOSIS — Z23 Encounter for immunization: Secondary | ICD-10-CM

## 2013-09-02 DIAGNOSIS — Z203 Contact with and (suspected) exposure to rabies: Secondary | ICD-10-CM

## 2013-09-02 MED ORDER — RABIES VACCINE, PCEC IM SUSR
1.0000 mL | Freq: Once | INTRAMUSCULAR | Status: AC
  Administered 2013-09-02: 1 mL via INTRAMUSCULAR

## 2013-09-02 NOTE — ED Notes (Signed)
Here for #3 of Rabies vaccination series. Denies any adverse reaction to previous vaccinations.

## 2013-09-09 ENCOUNTER — Emergency Department (INDEPENDENT_AMBULATORY_CARE_PROVIDER_SITE_OTHER)
Admission: EM | Admit: 2013-09-09 | Discharge: 2013-09-09 | Disposition: A | Discharge status: Home / Self Care | Payer: BC Managed Care – PPO | Source: Home / Self Care

## 2013-09-09 DIAGNOSIS — Z203 Contact with and (suspected) exposure to rabies: Secondary | ICD-10-CM

## 2013-09-09 DIAGNOSIS — Z23 Encounter for immunization: Secondary | ICD-10-CM

## 2013-09-09 MED ORDER — RABIES VACCINE, PCEC IM SUSR
1.0000 mL | Freq: Once | INTRAMUSCULAR | Status: AC
  Administered 2013-09-09: 1 mL via INTRAMUSCULAR

## 2013-09-09 NOTE — ED Notes (Signed)
Last rabies injection 

## 2013-10-25 IMAGING — CR DG CHEST 2V
2 series · 2 of 2 positions shown · non-contrast
Comparison: None.

CLINICAL DATA: Left-sided chest pain

CHEST - 2 VIEW

[w chest pa]
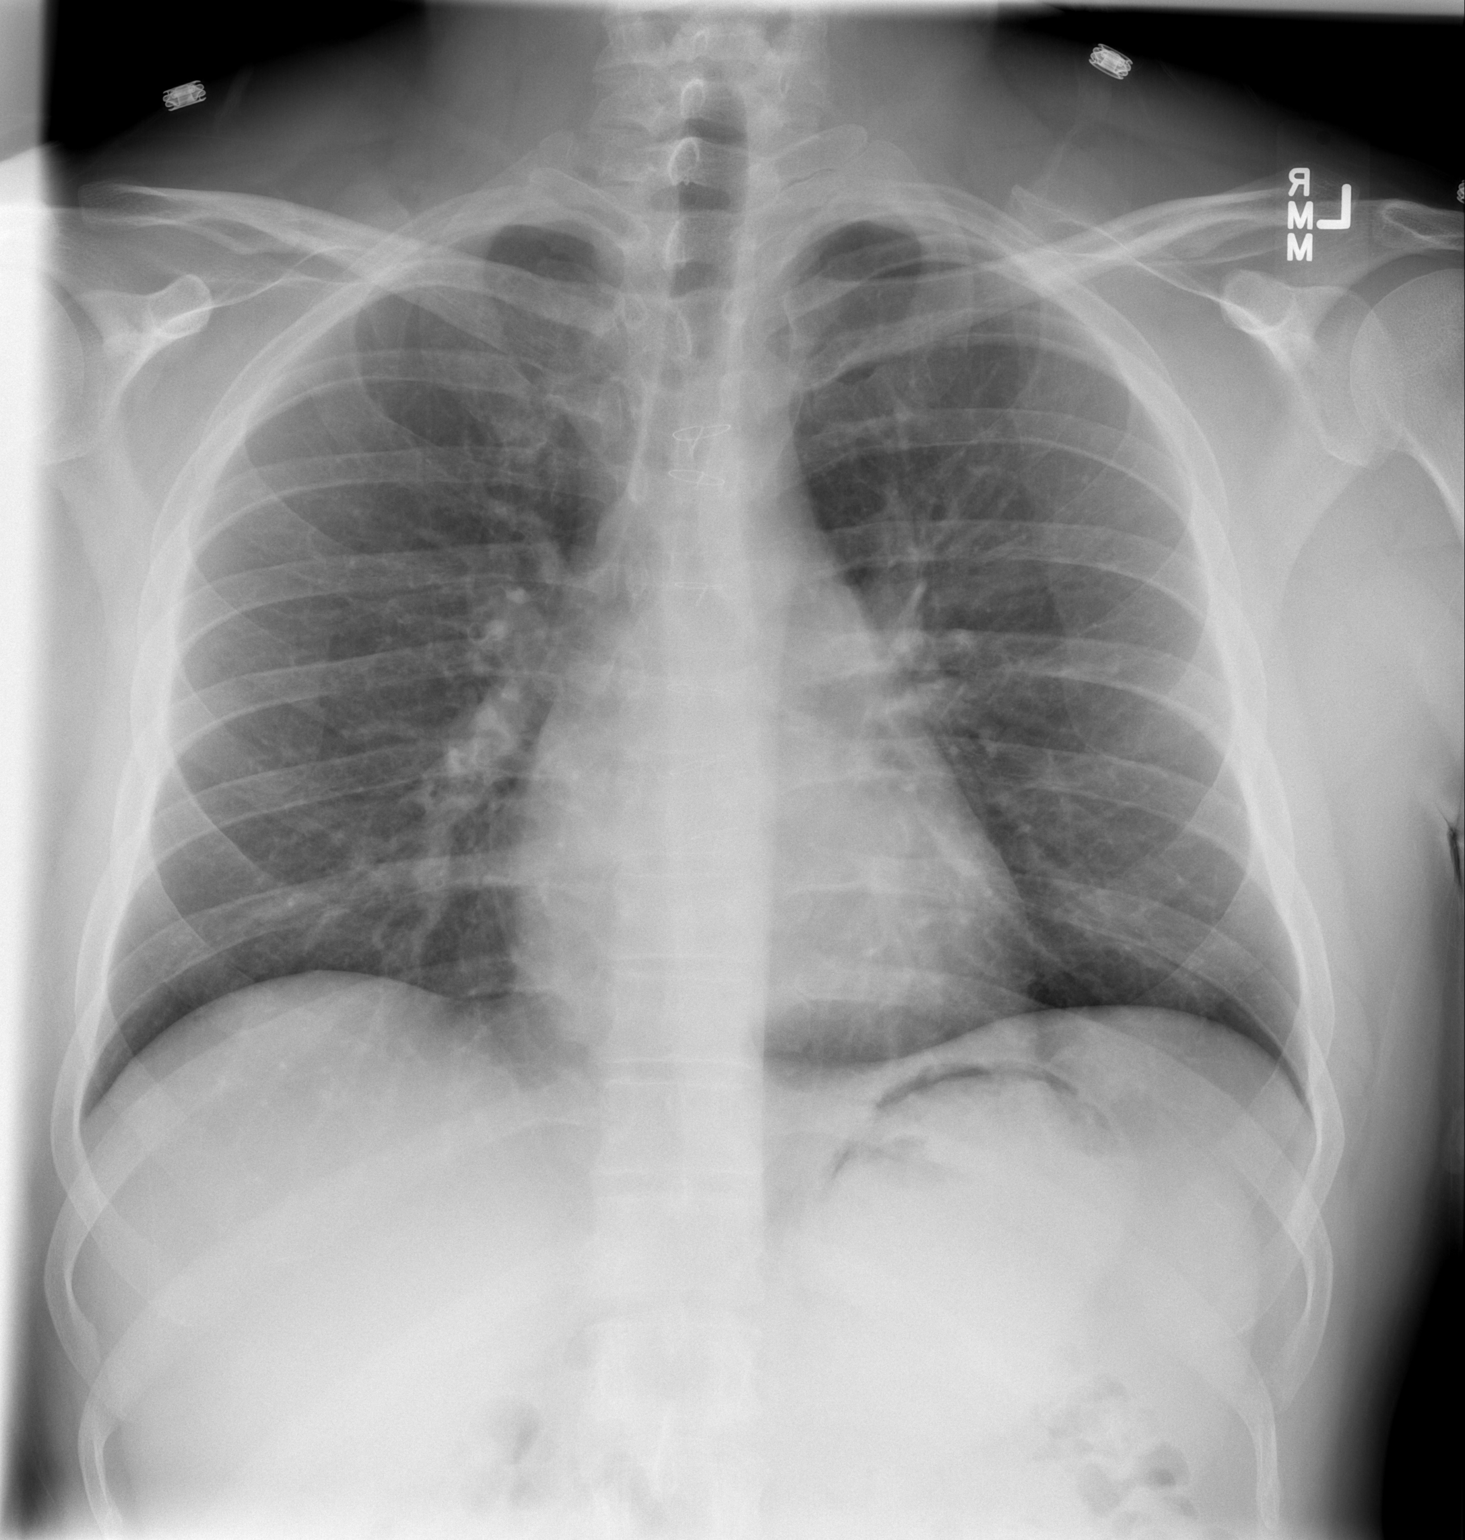

[w chest lat]
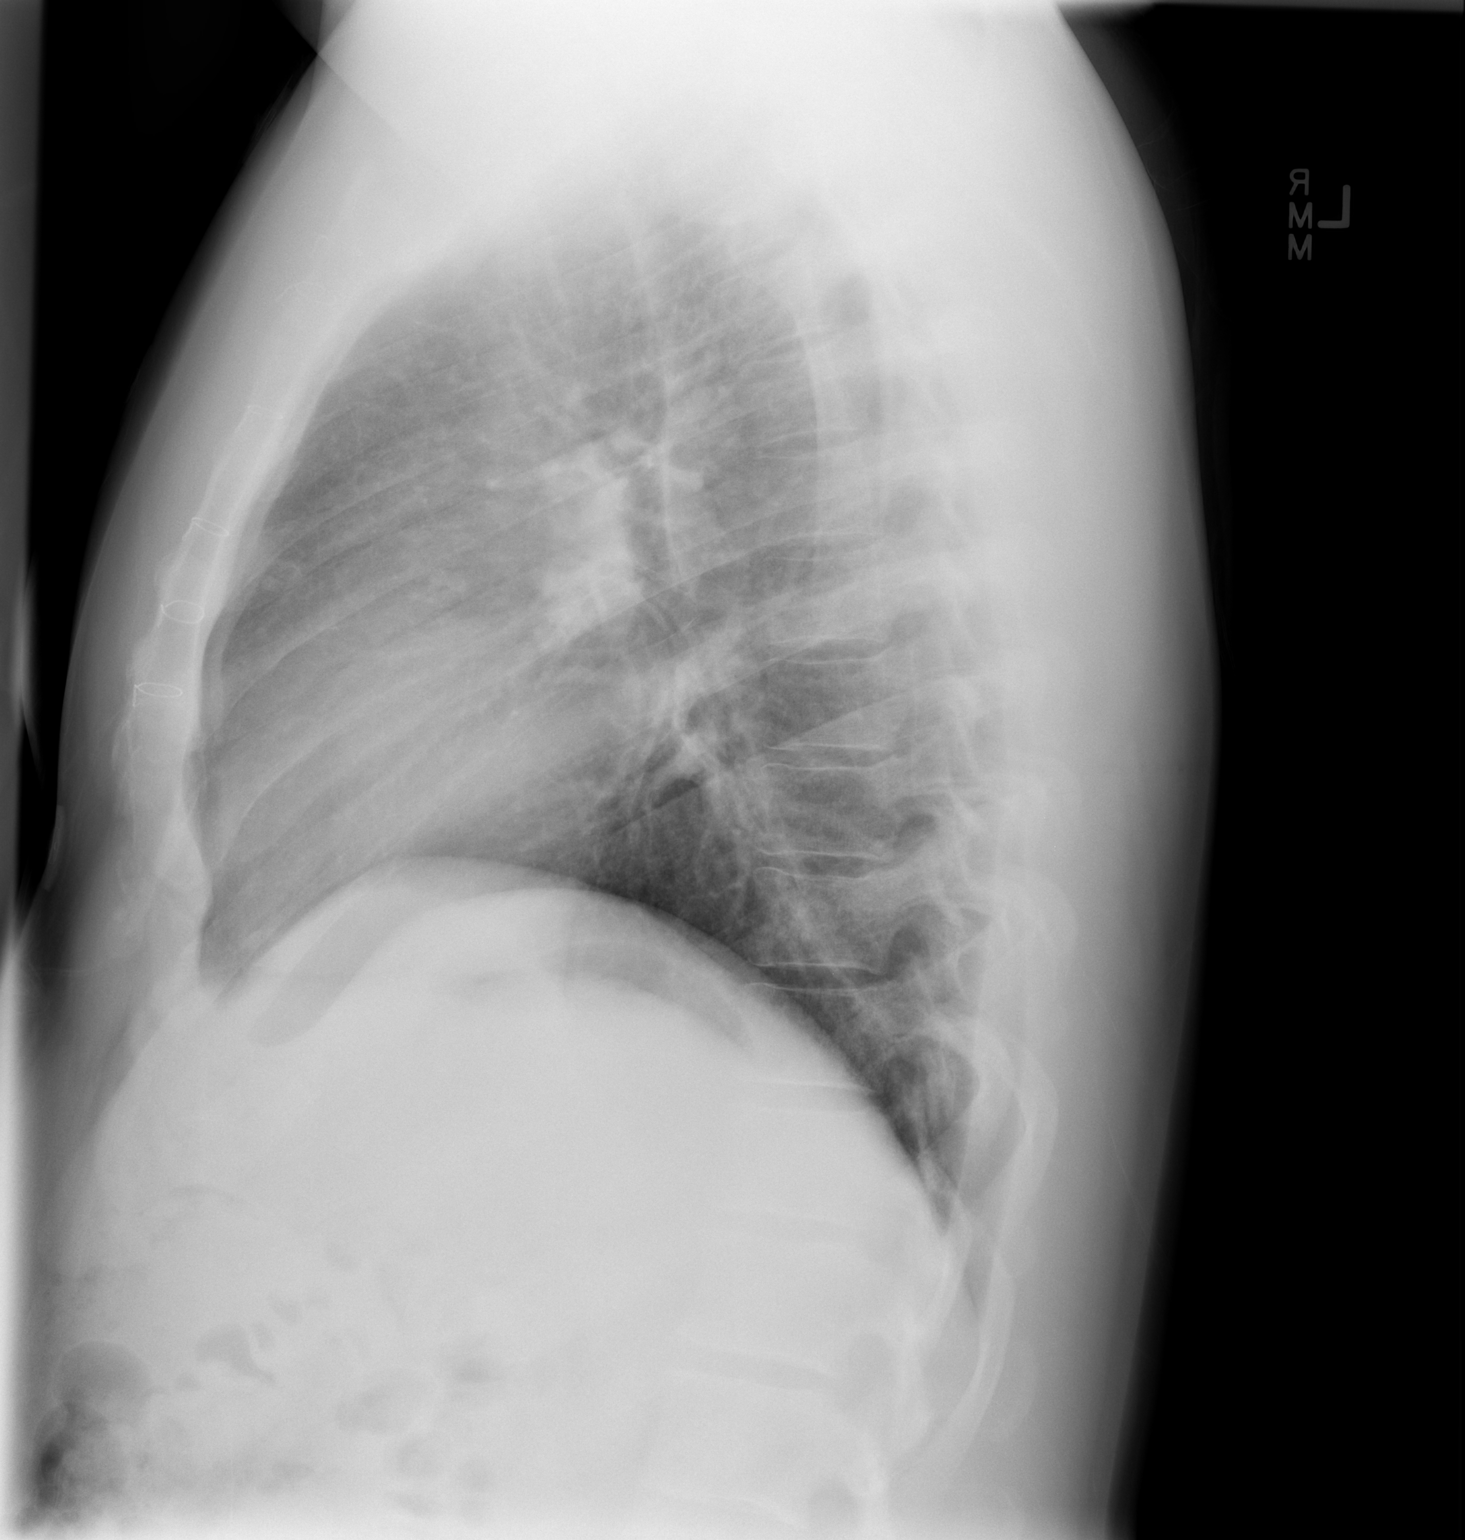

[2 of 2 positions shown; findings below may reference images not displayed]

FINDINGS: Lungs are clear. No pleural effusion or pneumothorax. The
cardiomediastinal contours are within normal limits. The visualized
bones and soft tissues are without significant appreciable
abnormality.
IMPRESSION: No radiographic evidence of acute cardiopulmonary process.

## 2013-12-27 DIAGNOSIS — K4020 Bilateral inguinal hernia, without obstruction or gangrene, not specified as recurrent: Secondary | ICD-10-CM | POA: Insufficient documentation

## 2013-12-27 DIAGNOSIS — I10 Essential (primary) hypertension: Secondary | ICD-10-CM | POA: Insufficient documentation

## 2014-02-10 DIAGNOSIS — Z09 Encounter for follow-up examination after completed treatment for conditions other than malignant neoplasm: Secondary | ICD-10-CM | POA: Insufficient documentation

## 2014-06-04 ENCOUNTER — Other Ambulatory Visit: Payer: Self-pay | Admitting: Cardiology

## 2014-07-26 ENCOUNTER — Encounter: Payer: Self-pay | Admitting: Cardiovascular Disease

## 2014-07-26 ENCOUNTER — Ambulatory Visit (INDEPENDENT_AMBULATORY_CARE_PROVIDER_SITE_OTHER): Payer: BLUE CROSS/BLUE SHIELD | Admitting: Cardiovascular Disease

## 2014-07-26 ENCOUNTER — Other Ambulatory Visit: Payer: Self-pay

## 2014-07-26 VITALS — BP 142/94 | HR 68 | Ht 71.0 in | Wt 218.2 lb

## 2014-07-26 DIAGNOSIS — R0789 Other chest pain: Secondary | ICD-10-CM

## 2014-07-26 MED ORDER — METOPROLOL SUCCINATE ER 25 MG PO TB24: 25.0000 mg | ORAL_TABLET | Freq: Two times a day (BID) | ORAL | Status: DC

## 2014-07-26 NOTE — Patient Instructions (Signed)
Medication Instructions:  Your physician has recommended you make the following change in your medication:  1.  INCREASE Metoprolol Succinate to 25mg  take one by mouth twice a day  Labwork: No new orders.  Testing/Procedures: Your physician has requested that you have an exercise tolerance test with PA/NP (1st available). For further information please visit [email or web address]. Please also follow instruction sheet, as given.  Your physician has recommended a Calcium Score.  Follow-Up: Your physician wants you to follow-up in: 1 YEAR with Dr Excell Seltzerooper.  You will receive a reminder letter in the mail two months in advance. If you don't receive a letter, please call our office to schedule the follow-up appointment.   Any Other Special Instructions Will Be Listed Below (If Applicable).

## 2014-07-26 NOTE — Progress Notes (Signed)
Cardiology Office Note   Date:  07/26/2014   ID:  Brandon Kemp, DOB [birth year], MRN [id number]  PCP:  Catalina PizzaHALL, ZACH, MD  Cardiologist:  Tonny Bollmanooper, Chablis Losh, MD    Chief Complaint  Patient presents with  . Appointment    BP issues     History of Present Illness: Brandon Kemp is a 39 y.o. male who presents for follow-up of hypertension. 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 An echocardiogram in 2013 showed normal left ventricular function, mild LVH, and moderate pulmonic regurgitation without significant pulmonic stenosis. He's undergone exercise stress testing that showed good exercise tolerance without significant ST or T-wave changes. Because of frequent palpitations, he underwent a Holter monitor showing rare PVCs. There is no sustained arrhythmias or significant pauses. A renal arterial duplex in 2013 was normal with no evidence of renal artery stenosis.  He's had a recent sinus infection and took Mucinex-D. His BP became severe elevated with diastolic BP > 100 mmHg. We discussed this over the phone and gave an extra dose of Toprol-XL with improvement. Also reports palpitations and chest discomfort. Notes increased stress with work and home issues. At times pain is relieved with belching. However, he had one episode with significant chest pressure and 'feeling of doom.'  No shortness of breath, lightheadedness, or edema.  Past Medical History  Diagnosis Date  . Congenital pulmonic valve stenosis     a. s/p surgical repair @ Age 11 in [place];  b.  12/12  Echo: EF 55-60%, nl wall motion, mild LVH, moderate PR.  . Chest pain     a. 09/2011 ETT @ [facility]: Walked 13 mins, rare PVC's, couplet (symptomatic), no acute st/t changes.  . History of tobacco abuse   . Symptomatic PVCs     a. 09/2011  . Hypertension     Past Surgical History  Procedure Laterality Date  . Pulmonary valvotomy  age 71  . Pulmonary outflow patch  age  59  . Hand surgery    . Ankle surgery      Current Outpatient Prescriptions  Medication Sig Dispense Refill  . finasteride (PROPECIA) 1 MG tablet Take 1 mg by mouth daily.    . metoprolol succinate (TOPROL-XL) 25 MG 24 hr tablet TAKE 1 TABLET BY MOUTH DAILY 90 tablet 0   No current facility-administered medications for this visit.    Allergies:   Review of patient's allergies indicates no known allergies.   Social History:  The patient  reports that he has quit smoking. He does not have any smokeless tobacco history on file. He reports that he drinks alcohol. He reports that he does not use illicit drugs.   Family History:  The patient's  family history includes Hypertension in his father; Evelene CroonWolff Parkinson White syndrome in his brother.    ROS:  Please see the history of present illness.  Otherwise, review of systems is positive for shortness of breath, palpitations, sinus congestion, headache.  All other systems are reviewed and negative.    PHYSICAL EXAM: VS:  BP 142/94 mmHg  Pulse 68  Ht 5\' 11"  (1.803 m)  Wt 218 lb 3.2 oz (98.975 kg)  BMI 30.45 kg/m2 , BMI Body mass index is 30.45 kg/(m^2). GEN: Well nourished, well developed, in no acute distress HEENT: normal Neck: no JVD, no masses. No carotid bruits Cardiac: RRR without murmur or gallop  Respiratory:  clear to auscultation bilaterally, normal work of breathing GI: soft, nontender, nondistended, + BS MS: no deformity or atrophy Ext: no pretibial edema, pedal pulses 2+= bilaterally Skin: warm and dry, no rash Neuro:  Strength and sensation are intact Psych: euthymic mood, full affect  EKG:  EKG is ordered today. The ekg ordered today shows NSR 68 bpm, possible LAE, otherwise within normal limits  Recent Labs: No results found for requested labs within last 365 days.   Lipid Panel     Component Value Date/Time   CHOL 220* 09/26/2011 1031   TRIG 120.0 09/26/2011 1031   HDL 43.00 09/26/2011 1031    CHOLHDL 5 09/26/2011 1031   VLDL 24.0 09/26/2011 1031   LDLDIRECT 155.7 09/26/2011 1031      Wt Readings from Last 3 Encounters:  07/26/14 218 lb 3.2 oz (98.975 kg)  08/29/13 210 lb (95.255 kg)  08/26/13 214 lb 14.4 oz (97.478 kg)    ASSESSMENT AND PLAN: 1.  Essential HTN, uncontrolled: recommend increase Toprol XL to 25 mg BID. Discussed consideration of a second antihypertensive agent but will try increase in Toprol XL first. Previous evaluation noted and reviewed (negative renal arterial doppler).  2. Pulmonic stenosis: no residual issues based on most recent echo. No murmur on exam.  3. Chest pain, typical and atypical features: advised exercise treadmill study and Cardiac CT for Calcium Score.    Current medicines are reviewed with the patient today.  The patient does not have concerns regarding medicines.  Labs/ tests ordered today include:  No orders of the defined types were placed in this encounter.    Disposition:   FU one year  Signed, [name], MD  07/26/2014 8:48 AM    [facility] [address] [place], [place], [place]  [address] Phone: [phone]; Fax: [phone]

## 2014-08-05 ENCOUNTER — Inpatient Hospital Stay: Admission: RE | Admit: 2014-08-05 | Payer: BLUE CROSS/BLUE SHIELD | Source: Ambulatory Visit

## 2014-08-08 ENCOUNTER — Ambulatory Visit (INDEPENDENT_AMBULATORY_CARE_PROVIDER_SITE_OTHER)
Admission: RE | Admit: 2014-08-08 | Discharge: 2014-08-08 | Disposition: A | Discharge status: Home / Self Care | Payer: Self-pay | Source: Ambulatory Visit | Attending: Cardiovascular Disease | Admitting: Cardiovascular Disease

## 2014-08-08 DIAGNOSIS — R0789 Other chest pain: Secondary | ICD-10-CM

## 2014-08-15 ENCOUNTER — Other Ambulatory Visit: Payer: Self-pay

## 2014-08-15 DIAGNOSIS — E785 Hyperlipidemia, unspecified: Secondary | ICD-10-CM

## 2014-09-07 ENCOUNTER — Encounter: Payer: BLUE CROSS/BLUE SHIELD | Admitting: Nurse Practitioner

## 2014-09-07 ENCOUNTER — Other Ambulatory Visit: Payer: BLUE CROSS/BLUE SHIELD

## 2014-09-09 ENCOUNTER — Other Ambulatory Visit (INDEPENDENT_AMBULATORY_CARE_PROVIDER_SITE_OTHER): Payer: BLUE CROSS/BLUE SHIELD | Admitting: *Deleted

## 2014-09-09 DIAGNOSIS — E785 Hyperlipidemia, unspecified: Secondary | ICD-10-CM | POA: Diagnosis not present

## 2014-09-09 LAB — LIPID PANEL
CHOL/HDL RATIO: 5
Cholesterol: 211 mg/dL — ABNORMAL HIGH (ref 0–200)
HDL: 38.6 mg/dL — AB (ref >39.00)
LDL Cholesterol: 149 mg/dL — ABNORMAL HIGH (ref 0–99)
NONHDL: 172.4
Triglycerides: 118 mg/dL (ref 0.0–149.0)
VLDL: 23.6 mg/dL (ref 0.0–40.0)

## 2014-09-09 LAB — HEPATIC FUNCTION PANEL
ALT: 21 U/L (ref 0–53)
AST: 23 U/L (ref 0–37)
Albumin: 4.5 g/dL (ref 3.5–5.2)
Alkaline Phosphatase: 59 U/L (ref 39–117)
BILIRUBIN DIRECT: 0.1 mg/dL (ref 0.0–0.3)
BILIRUBIN TOTAL: 0.7 mg/dL (ref 0.2–1.2)
Total Protein: 6.9 g/dL (ref 6.0–8.3)

## 2014-09-09 NOTE — Addendum Note (Signed)
Addended by: Tonita PhoenixBOWDEN, Taft Worthing K on: 09/09/2014 09:02 AM   Modules accepted: Orders

## 2014-09-20 ENCOUNTER — Other Ambulatory Visit: Payer: Self-pay | Admitting: Cardiovascular Disease

## 2014-09-21 ENCOUNTER — Other Ambulatory Visit: Payer: Self-pay | Admitting: Cardiovascular Disease

## 2014-09-23 ENCOUNTER — Other Ambulatory Visit: Payer: Self-pay | Admitting: Cardiovascular Disease

## 2014-09-23 NOTE — Telephone Encounter (Signed)
Rx(s) sent to pharmacy electronically.  

## 2014-09-24 ENCOUNTER — Other Ambulatory Visit: Payer: Self-pay | Admitting: Cardiovascular Disease

## 2014-10-06 ENCOUNTER — Other Ambulatory Visit: Payer: Self-pay

## 2014-10-06 DIAGNOSIS — E785 Hyperlipidemia, unspecified: Secondary | ICD-10-CM

## 2015-01-04 ENCOUNTER — Other Ambulatory Visit: Payer: BLUE CROSS/BLUE SHIELD

## 2015-02-01 ENCOUNTER — Encounter: Payer: Self-pay | Admitting: Cardiovascular Disease

## 2015-02-01 ENCOUNTER — Ambulatory Visit (INDEPENDENT_AMBULATORY_CARE_PROVIDER_SITE_OTHER): Payer: BLUE CROSS/BLUE SHIELD | Admitting: Cardiovascular Disease

## 2015-02-01 VITALS — BP 132/86 | HR 72 | Ht 71.0 in | Wt 205.0 lb

## 2015-02-01 DIAGNOSIS — I10 Essential (primary) hypertension: Secondary | ICD-10-CM

## 2015-02-01 MED ORDER — ATORVASTATIN CALCIUM 10 MG PO TABS: 10.0000 mg | ORAL_TABLET | Freq: Every day | ORAL | Status: DC

## 2015-02-01 NOTE — Patient Instructions (Signed)
Medication Instructions:  Your physician has recommended you make the following change in your medication:  1. DECREASE Metoprolol Succinate to 12.5mg  daily for three days and then STOP 2. START Atorvastatin 10mg  take one tablet by mouth every evening  Labwork: Your physician recommends that you return for a FASTING LIPID and LIVER in 8 WEEKS--nothing to eat or drink after midnight, lab opens at 7:30 AM  Testing/Procedures: No new orders.   Follow-Up: Your physician has requested that you regularly monitor and record your blood pressure readings at home. Please use the same machine at the same time of day to check your readings and record them to bring to your follow-up visit.  Please check your BP three times per week and call the office with readings.   Your physician wants you to follow-up in: 6 MONTHS with Dr Excell Seltzerooper.  You will receive a reminder letter in the mail two months in advance. If you don't receive a letter, please call our office to schedule the follow-up appointment.     Any Other Special Instructions Will Be Listed Below (If Applicable).     If you need a refill on your cardiac medications before your next appointment, please call your pharmacy.

## 2015-02-01 NOTE — Progress Notes (Signed)
Cardiology Office Note Date:  02/01/2015   ID:  Brandon Kemp, DOB 1975-06-29, MRN [id number]  PCP:  Alysia PennaHOLWERDA, SCOTT, MD  Cardiologist:  Tonny Bollmanooper, Henchy Mccauley, MD    No chief complaint on file.    History of Present Illness: Brandon DriverDavid R Doster Kemp is a 39 y.o. male who presents for follow-up of hypertension. The patient has a history of congenital pulmonic stenosis and underwent surgical repair at age 12. An echocardiogram in 2013 showed normal left ventricular function, mild LVH, and moderate pulmonic regurgitation without significant pulmonic stenosis. He's undergone exercise stress testing that showed good exercise tolerance without significant ST or T-wave changes. Because of frequent palpitations, he underwent a Holter monitor showing rare PVCs. There is no sustained arrhythmias or significant pauses. A renal arterial duplex in 2013 was normal with no evidence of renal artery stenosis. He had cardiac cath earlier this year showing mild nonobstructive CAD.   He has focused on physical fitness with regular running and boot camp workouts. He's lost about 15 pounds and feels well. He continues to have occasional palpitations and takes Toprol XL most days but does forget a few doses every week. He has no exertional symptoms. He hasn't checked his BP at home in several months.    Past Medical History  Diagnosis Date  . Congenital pulmonic valve stenosis     a. s/p surgical repair @ Age 61 in [place];  b.  12/12  Echo: EF 55-60%, nl wall motion, mild LVH, moderate PR.  . Chest pain     a. 09/2011 ETT @ [facility]: Walked 13 mins, rare PVC's, couplet (symptomatic), no acute st/t changes.  . History of tobacco abuse   . Symptomatic PVCs     a. 09/2011  . Hypertension     Past Surgical History  Procedure Laterality Date  . Pulmonary valvotomy  age 32  . Pulmonary outflow patch  age 32  . Hand surgery    . Ankle surgery      Current Outpatient Prescriptions  Medication Sig Dispense Refill  .  atorvastatin (LIPITOR) 10 MG tablet Take 1 tablet (10 mg total) by mouth daily. 90 tablet 3  . finasteride (PROPECIA) 1 MG tablet Take 1 mg by mouth daily.     No current facility-administered medications for this visit.    Allergies:   Review of patient's allergies indicates no known allergies.   Social History:  The patient  reports that he has quit smoking. He does not have any smokeless tobacco history on file. He reports that he drinks alcohol. He reports that he does not use illicit drugs.   Family History:  The patient's  family history includes Hypertension in his father; Evelene CroonWolff Parkinson White syndrome in his brother.    ROS:  Please see the history of present illness.  Otherwise, review of systems is positive for cough, palpitations.  All other systems are reviewed and negative.    PHYSICAL EXAM: VS:  BP 132/86 mmHg  Pulse 72  Ht 5\' 11"  (1.803 m)  Wt 205 lb (92.987 kg)  BMI 28.60 kg/m2  SpO2 99% , BMI Body mass index is 28.6 kg/(m^2). GEN: Well nourished, well developed, in no acute distress Remainder of exam not done today as our time was spent in counseling  EKG:  EKG is not ordered today.  Recent Labs: 09/09/2014: ALT 21   Lipid Panel     Component Value Date/Time   CHOL 211* 09/09/2014 0902   TRIG 118.0 09/09/2014 0902  HDL 38.60* 09/09/2014 0902   CHOLHDL 5 09/09/2014 0902   VLDL 23.6 09/09/2014 0902   LDLCALC 149* 09/09/2014 0902   LDLDIRECT 155.7 09/26/2011 1031      Wt Readings from Last 3 Encounters:  02/01/15 205 lb (92.987 kg)  07/26/14 218 lb 3.2 oz (98.975 kg)  08/29/13 210 lb (95.255 kg)    ASSESSMENT AND PLAN: 1.  Essential Hypertension: I think it's reasonable to discontinue Toprol XL since Brandon Kemp has lost weight and made such improvements in his lifestyle. He will monitor BP over the next few weeks and call in with readings. If her requires antihypertensive Rx, will probably start him on Valsartan.  2. Pulmonic stenosis: will follow  clinically. No symptoms.   3. Mild nonobstructive CAD: continued risk reduction with ASA 81 mg. See below for discussion of lipids. Outside cardiac films reviewed with the patient today and loaded onto the PACS system. He has mild irregularity of the LAD and smooth nonobstructive stenosis of the mid-RCA.   4. Hyperlipidemia: LDL 149. Add atorvastatin 10 mg. Repeat lipids/LFT's 8 weeks.   Current medicines are reviewed with the patient today.  The patient does not have concerns regarding medicines.  Labs/ tests ordered today include:  Orders Placed This Encounter  Procedures  . Lipid panel  . Hepatic function panel    Disposition:   FU 6 months  Signed, [name], MD  02/01/2015 2:31 PM    [facility] [address] [place], [place], [place]  [address] Phone: [phone]; Fax: [phone]

## 2015-02-14 ENCOUNTER — Encounter: Payer: Self-pay | Admitting: Cardiovascular Disease

## 2015-02-14 NOTE — Progress Notes (Signed)
Pt called in with home BP readings, consistently greater than 140/90 since stopping Toprol XL. Recommend start Diovan 80 mg daily.  Tonny BollmanCooper, Leslieann Whisman 02/14/2015 3:51 PM

## 2015-02-15 ENCOUNTER — Other Ambulatory Visit: Payer: Self-pay

## 2015-02-15 MED ORDER — VALSARTAN 80 MG PO TABS: 80.0000 mg | ORAL_TABLET | Freq: Every day | ORAL | Status: DC

## 2015-02-15 NOTE — Progress Notes (Signed)
Rx sent to the pharmacy.

## 2015-03-29 ENCOUNTER — Other Ambulatory Visit (INDEPENDENT_AMBULATORY_CARE_PROVIDER_SITE_OTHER): Payer: BLUE CROSS/BLUE SHIELD

## 2015-03-29 DIAGNOSIS — I10 Essential (primary) hypertension: Secondary | ICD-10-CM | POA: Diagnosis not present

## 2015-08-23 ENCOUNTER — Other Ambulatory Visit: Payer: Self-pay | Admitting: Cardiovascular Disease

## 2016-04-19 ENCOUNTER — Other Ambulatory Visit: Payer: Self-pay | Admitting: Cardiovascular Disease

## 2016-06-07 ENCOUNTER — Other Ambulatory Visit: Payer: Self-pay | Admitting: Cardiovascular Disease

## 2016-07-01 ENCOUNTER — Other Ambulatory Visit: Payer: Self-pay | Admitting: Cardiovascular Disease

## 2016-07-08 ENCOUNTER — Other Ambulatory Visit: Payer: Self-pay | Admitting: *Deleted

## 2016-07-18 ENCOUNTER — Other Ambulatory Visit: Payer: Self-pay

## 2016-07-18 DIAGNOSIS — R7309 Other abnormal glucose: Secondary | ICD-10-CM

## 2016-07-18 DIAGNOSIS — R634 Abnormal weight loss: Secondary | ICD-10-CM

## 2016-07-23 ENCOUNTER — Other Ambulatory Visit: Payer: BLUE CROSS/BLUE SHIELD

## 2016-07-23 DIAGNOSIS — R7309 Other abnormal glucose: Secondary | ICD-10-CM

## 2016-07-23 DIAGNOSIS — R634 Abnormal weight loss: Secondary | ICD-10-CM

## 2016-07-23 LAB — LIPID PANEL
CHOL/HDL RATIO: 2.6 ratio (ref 0.0–5.0)
Cholesterol, Total: 155 mg/dL (ref 100–199)
HDL: 60 mg/dL (ref >39)
LDL CALC: 86 mg/dL (ref 0–99)
Triglycerides: 46 mg/dL (ref 0–149)
VLDL CHOLESTEROL CAL: 9 mg/dL (ref 5–40)

## 2016-07-23 LAB — COMPREHENSIVE METABOLIC PANEL
ALBUMIN: 4.7 g/dL (ref 3.5–5.5)
ALT: 31 IU/L (ref 0–44)
AST: 23 IU/L (ref 0–40)
Albumin/Globulin Ratio: 2.8 — ABNORMAL HIGH (ref 1.2–2.2)
Alkaline Phosphatase: 56 IU/L (ref 39–117)
BUN/Creatinine Ratio: 13 (ref 9–20)
BUN: 15 mg/dL (ref 6–24)
Bilirubin Total: 0.7 mg/dL (ref 0.0–1.2)
CO2: 25 mmol/L (ref 18–29)
CREATININE: 1.12 mg/dL (ref 0.76–1.27)
Calcium: 10 mg/dL (ref 8.7–10.2)
Chloride: 99 mmol/L (ref 96–106)
GFR calc Af Amer: 94 mL/min/{1.73_m2} (ref >59)
GFR, EST NON AFRICAN AMERICAN: 81 mL/min/{1.73_m2} (ref >59)
Globulin, Total: 1.7 g/dL (ref 1.5–4.5)
Glucose: 117 mg/dL — ABNORMAL HIGH (ref 65–99)
POTASSIUM: 5.1 mmol/L (ref 3.5–5.2)
Sodium: 139 mmol/L (ref 134–144)
Total Protein: 6.4 g/dL (ref 6.0–8.5)

## 2016-07-24 ENCOUNTER — Other Ambulatory Visit: Payer: Self-pay | Admitting: Cardiovascular Disease

## 2016-07-25 NOTE — Telephone Encounter (Signed)
Yes- this is fine; thanks- 

## 2016-07-30 ENCOUNTER — Other Ambulatory Visit: Payer: Self-pay

## 2016-07-30 MED ORDER — VALSARTAN 80 MG PO TABS: ORAL_TABLET | ORAL | 2 refills | Status: DC

## 2016-07-30 MED ORDER — ATORVASTATIN CALCIUM 10 MG PO TABS: ORAL_TABLET | ORAL | 2 refills | Status: DC

## 2016-08-12 ENCOUNTER — Other Ambulatory Visit: Payer: Self-pay | Admitting: Cardiovascular Disease

## 2017-01-05 ENCOUNTER — Telehealth: Payer: Self-pay | Admitting: Internal Medicine

## 2017-01-05 NOTE — Telephone Encounter (Signed)
D/W PT  ABOUT  His  Request  For  Losartan , Dr Excell Seltzerooper  Already called  It  In   And  Will be  avaialable  In AM , if  nto  He  Can call  Office  For  Refill of  Losartan  In AM

## 2017-01-17 ENCOUNTER — Other Ambulatory Visit: Payer: Self-pay | Admitting: Internal Medicine

## 2017-01-17 DIAGNOSIS — R413 Other amnesia: Secondary | ICD-10-CM

## 2017-01-30 ENCOUNTER — Ambulatory Visit
Admission: RE | Admit: 2017-01-30 | Discharge: 2017-01-30 | Disposition: A | Discharge status: Home / Self Care | Payer: BLUE CROSS/BLUE SHIELD | Source: Ambulatory Visit | Attending: Internal Medicine | Admitting: Internal Medicine

## 2017-01-30 DIAGNOSIS — R413 Other amnesia: Secondary | ICD-10-CM

## 2017-02-13 ENCOUNTER — Encounter: Payer: Self-pay | Admitting: Neurology

## 2017-03-05 ENCOUNTER — Ambulatory Visit (INDEPENDENT_AMBULATORY_CARE_PROVIDER_SITE_OTHER): Payer: BLUE CROSS/BLUE SHIELD | Admitting: Neurology

## 2017-03-05 ENCOUNTER — Encounter: Payer: Self-pay | Admitting: Neurology

## 2017-03-05 VITALS — BP 128/74 | HR 74 | Ht 70.5 in | Wt 178.6 lb

## 2017-03-05 DIAGNOSIS — I10 Essential (primary) hypertension: Secondary | ICD-10-CM | POA: Diagnosis not present

## 2017-03-05 DIAGNOSIS — F329 Major depressive disorder, single episode, unspecified: Secondary | ICD-10-CM | POA: Diagnosis not present

## 2017-03-05 DIAGNOSIS — I679 Cerebrovascular disease, unspecified: Secondary | ICD-10-CM | POA: Diagnosis not present

## 2017-03-05 DIAGNOSIS — F419 Anxiety disorder, unspecified: Secondary | ICD-10-CM

## 2017-03-05 DIAGNOSIS — R413 Other amnesia: Secondary | ICD-10-CM | POA: Diagnosis not present

## 2017-03-05 DIAGNOSIS — R9082 White matter disease, unspecified: Secondary | ICD-10-CM

## 2017-03-05 DIAGNOSIS — E785 Hyperlipidemia, unspecified: Secondary | ICD-10-CM | POA: Diagnosis not present

## 2017-03-05 DIAGNOSIS — G250 Essential tremor: Secondary | ICD-10-CM | POA: Diagnosis not present

## 2017-03-05 DIAGNOSIS — I2510 Atherosclerotic heart disease of native coronary artery without angina pectoris: Secondary | ICD-10-CM | POA: Diagnosis not present

## 2017-03-05 NOTE — Progress Notes (Signed)
NEUROLOGY CONSULTATION NOTE  Brandon Kemp MRN: [id number] DOB: 12/19/1975  Referring provider: Dr. Link Snuffer Primary care provider: Dr. Link Snuffer  Reason for consult:  Abnormal brain MRI  HISTORY OF PRESENT ILLNESS: Brandon Kemp is a 42 year old male with mild CAD (diagnosed on cardiac cath), hypertension, hyperlipidemia and history of tobacco use who presents for abnormal brain MRI.  He is accompanied by his wife who supplements history.  He started experiencing symptoms in 2017.  At that time, he developed increased depression and anxiety.  He reported both work and family-related stress.  He started having cognitive difficulty.  He reported trouble planning and organizing.  It affected his ability to manage finances and it affected his work.  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  He began feeling overwhelmed and was easily distracted.  He subsequently lost his job.  He requires reminders about routine daily chores such as when to put his children to bed.  He reports increased drowsiness.  He lost about 30 lbs over the past year, some of it intentionally.  He reports intermittent blurred vision with increased difficulty focusing.  He has not had his eyes examined.  He also reports tremor in the hands for the past 2 years.  He sometimes feels a tingling and pressure in his head.  He has tried several antidepressants which were discontinued due to side effects.  Labs from August 2018 include B12 945 (on supplements), TSH 2.170, D 43.4.  MRI of brain without contrast from 01/31/17 was personally reviewed and revealed mild white matter changes, more than expected for age.  He has remote history of headaches and ocular migraines.  PAST MEDICAL HISTORY: Past Medical History:  Diagnosis Date  . Chest pain    a. 09/2011 ETT @ [facility]: Walked 13 mins, rare PVC's, couplet (symptomatic), no acute st/t changes.  . Congenital pulmonic valve  stenosis    a. s/p surgical repair @ Age 4 in [place];  b.  12/12  Echo: EF 55-60%, nl wall motion, mild LVH, moderate PR.  Marland Kitchen History of tobacco abuse   . Hypertension   . Symptomatic PVCs    a. 09/2011    PAST SURGICAL HISTORY: Past Surgical History:  Procedure Laterality Date  . ANKLE SURGERY    . HAND SURGERY    . pulmonary outflow patch  age 53  . pulmonary valvotomy  age 53    MEDICATIONS: Current Outpatient Medications on File Prior to Visit  Medication Sig Dispense Refill  . Desvenlafaxine Succinate (PRISTIQ PO) Take 50 mg by mouth daily.    Marland Kitchen atorvastatin (LIPITOR) 10 MG tablet TAKE 1 TABLET BY MOUTH EVERY DAY **NEED OFFICE VISIT FOR FURTHER REFILLS** (Patient not taking: Reported on 03/05/2017) 30 tablet 2  . finasteride (PROPECIA) 1 MG tablet Take 1 mg by mouth daily.    Marland Kitchen losartan (COZAAR) 50 MG tablet Take 50 mg by mouth daily.  10  . valsartan (DIOVAN) 80 MG tablet TAKE 1 TABLET BY MOUTH EVERY DAY PT IS OVERDUE FOR APPT (Patient not taking: Reported on 03/05/2017) 30 tablet 2   No current facility-administered medications on file prior to visit.     ALLERGIES: No Known Allergies  FAMILY HISTORY: Family History  Problem Relation Age of Onset  . Evelene Croon Parkinson White syndrome Brother   . Hypertension Father     SOCIAL HISTORY: Social History   Socioeconomic History  . Marital status: Married  Spouse name: Not on file  . Number of children: Not on file  . Years of education: Not on file  . Highest education level: Bachelor's degree (e.g., BA, AB, BS)  Social Needs  . Financial resource strain: Not on file  . Food insecurity - worry: Not on file  . Food insecurity - inability: Not on file  . Transportation needs - medical: Not on file  . Transportation needs - non-medical: Not on file  Occupational History  . Not on file  Tobacco Use  . Smoking status: Former Games developermoker  . Smokeless tobacco: Never Used  . Tobacco comment: sometimes, when stressed about 2  cigarettes a day  Substance and Sexual Activity  . Alcohol use: No    Frequency: Never    Comment: social  . Drug use: No  . Sexual activity: Not on file  Other Topics Concern  . Not on file  Social History Narrative  . Not on file    REVIEW OF SYSTEMS: Constitutional: No fevers, chills, or sweats, no generalized fatigue, change in appetite Eyes: No visual changes, double vision, eye pain Ear, nose and throat: No hearing loss, ear pain, nasal congestion, sore throat Cardiovascular: No chest pain, palpitations Respiratory:  No shortness of breath at rest or with exertion, wheezes GastrointestinaI: No nausea, vomiting, diarrhea, abdominal pain, fecal incontinence Genitourinary:  No dysuria, urinary retention or frequency Musculoskeletal:  No neck pain, back pain Integumentary: No rash, pruritus, skin lesions Neurological: as above Psychiatric: No depression, insomnia, anxiety Endocrine: No palpitations, fatigue, diaphoresis, mood swings, change in appetite, change in weight, increased thirst Hematologic/Lymphatic:  No purpura, petechiae. Allergic/Immunologic: no itchy/runny eyes, nasal congestion, recent allergic reactions, rashes  PHYSICAL EXAM: Vitals:   03/05/17 0926  BP: 128/74  Pulse: 74  SpO2: 99%   General: No acute distress.  Patient appears well-groomed.  Mild depressed mood. Head:  Normocephalic/atraumatic Eyes:  fundi examined but not visualized Neck: supple, no paraspinal tenderness, full range of motion Back: No paraspinal tenderness Heart: regular rate and rhythm Lungs: Clear to auscultation bilaterally. Vascular: No carotid bruits. Neurological Exam: Mental status: alert and oriented to person, place, and time, delayed recall poor, remote memory intact, fund of knowledge intact, trouble with serial 7 subtraction but otherwise attention and concentration intact, speech fluent and not dysarthric, language intact. Montreal Cognitive Assessment  03/05/2017    Visuospatial/ Executive (0/5) 5  Naming (0/3) 3  Attention: Read list of digits (0/2) 2  Attention: Read list of letters (0/1) 1  Attention: Serial 7 subtraction starting at 100 (0/3) 1  Language: Repeat phrase (0/2) 2  Language : Fluency (0/1) 1  Abstraction (0/2) 2  Delayed Recall (0/5) 0  Orientation (0/6) 6  Total 23  Adjusted Score (based on education) 23   Cranial nerves: CN I: not tested CN Kemp: pupils equal, round and reactive to light, visual fields intact CN III, IV, VI:  full range of motion, no nystagmus, no ptosis CN V: facial sensation intact CN VII: upper and lower face symmetric CN VIII: hearing intact CN IX, X: gag intact, uvula midline CN XI: sternocleidomastoid and trapezius muscles intact CN XII: tongue midline Bulk & Tone: normal, no fasciculations. Motor:  5/5 throughout  Sensation: temperature and vibration sensation intact. Deep Tendon Reflexes:  2+ throughout, toes downgoing.  Finger to nose testing:  Mild bilateral postural and kinetic tremor.  Without dysmetria.  Heel to shin:  Without dysmetria.  Gait:  Normal station and stride.  Able to turn and  tandem walk. Romberg negative.  IMPRESSION: 1.  Abnormal white matter changes on brain MRI.  Nonspecific finding but likely chronic small vessel ischemic changes in a man with CAD, hypertension, hyperlipidemia and former cigarette use.  It is not suggestive of a demyelinating disease. 2.  Cognitive changes.  Consider secondary to depression and anxiety but must also rule out vascular etiology. 3.  Essential tremor 4.  Depression and anxiety.  PLAN: 1.  We will order neuropsychological testing.  Further recommendations pending results. 2.  Although I feel that the MRI findings are sequelae of cerebrovascular disease, we will repeat in 6 months to evaluate for any significant changes. 3.  Recommend taking ASA 81mg  daily 4.  Recommend continued focus on treating depression and anxiety. 5.  Follow up in 6  months (after repeat MRI) or sooner if neuropsych testing results require it.  Thank you for allowing me to take part in the care of this patient.  Shon Millet, DO  CC:  Alysia Penna, MD

## 2017-03-05 NOTE — Patient Instructions (Signed)
1.  We will order neurocognitive testing. 2.  We will repeat MRI of brain in 6 months. 3.  Follow up in 6 months (or sooner if neurocognitive testing requires it) 4.  Recommend taking aspirin 81mg  daily

## 2017-03-20 ENCOUNTER — Telehealth: Payer: Self-pay | Admitting: Neurology

## 2017-03-20 NOTE — Telephone Encounter (Signed)
Patient called and said that he was able to find a Doctor in [place] who does the same testing as Dr. Alinda DoomsBailar and are able to get in to see them at the end of the month. He is scheduled to see Dr. Alinda DoomsBailar in May.  He is asking can they use the testing to compare to the MRI? He also said he had some questions. Please Call. Thanks

## 2017-03-21 ENCOUNTER — Telehealth: Payer: Self-pay | Admitting: Neurology

## 2017-03-21 NOTE — Telephone Encounter (Signed)
Called Pt, VM is full, unable to leave message

## 2017-03-21 NOTE — Telephone Encounter (Signed)
Pt returned your call.  

## 2017-03-26 NOTE — Telephone Encounter (Signed)
Called and spoke with Pt, advsd him it is fine to get that testing done elsewhere, and if he does decide that to please call back so we can cancel tests here with Dr Alinda DoomsBailar. Discussed also f/u MRI prior to seeing Dr Everlena CooperJaffe, Pt did not recall that.

## 2017-06-30 ENCOUNTER — Encounter: Payer: Self-pay | Admitting: Neurology

## 2017-07-07 ENCOUNTER — Telehealth: Payer: Self-pay | Admitting: Neurology

## 2017-07-07 DIAGNOSIS — R9082 White matter disease, unspecified: Secondary | ICD-10-CM

## 2017-07-07 NOTE — Telephone Encounter (Signed)
Called and spoke with Pt, advsd him order is in at [facility] and they will contact him directly to schedule.

## 2017-07-07 NOTE — Telephone Encounter (Signed)
Pt called and said Dr Everlena Cooper suggested a second MRI and please call and advise on that

## 2017-07-08 ENCOUNTER — Ambulatory Visit (INDEPENDENT_AMBULATORY_CARE_PROVIDER_SITE_OTHER): Payer: BLUE CROSS/BLUE SHIELD | Admitting: Psychology

## 2017-07-08 DIAGNOSIS — F419 Anxiety disorder, unspecified: Secondary | ICD-10-CM | POA: Diagnosis not present

## 2017-07-09 NOTE — Telephone Encounter (Signed)
Pt called, was unsure how our conversation ended about the MRI. I provided Pt with the telephone number to [facility] to contact them to schedule if he has not heard from them.

## 2017-07-18 ENCOUNTER — Other Ambulatory Visit: Payer: Self-pay | Admitting: Neurology

## 2017-07-18 ENCOUNTER — Ambulatory Visit
Admission: RE | Admit: 2017-07-18 | Discharge: 2017-07-18 | Disposition: A | Discharge status: Home / Self Care | Payer: BLUE CROSS/BLUE SHIELD | Source: Ambulatory Visit | Attending: Neurology | Admitting: Neurology

## 2017-07-18 ENCOUNTER — Inpatient Hospital Stay: Admission: RE | Admit: 2017-07-18 | Payer: BLUE CROSS/BLUE SHIELD | Source: Ambulatory Visit

## 2017-07-18 DIAGNOSIS — R9082 White matter disease, unspecified: Secondary | ICD-10-CM

## 2017-07-18 DIAGNOSIS — M654 Radial styloid tenosynovitis [de Quervain]: Secondary | ICD-10-CM | POA: Insufficient documentation

## 2017-07-22 ENCOUNTER — Encounter: Payer: BLUE CROSS/BLUE SHIELD | Admitting: Psychology

## 2017-07-22 ENCOUNTER — Encounter

## 2017-07-23 ENCOUNTER — Telehealth: Payer: Self-pay

## 2017-07-23 NOTE — Telephone Encounter (Signed)
Called and LMOVM advising MRI is stable and to call with any questions

## 2017-07-23 NOTE — Telephone Encounter (Signed)
-----   Message from Drema Dallas, DO sent at 07/21/2017  9:22 AM EDT ----- Reviewed MRI of brain.  To me, it looks overall stable compared to prior MRI.

## 2017-08-11 ENCOUNTER — Ambulatory Visit: Payer: BLUE CROSS/BLUE SHIELD | Admitting: Psychology

## 2017-08-11 ENCOUNTER — Encounter: Payer: BLUE CROSS/BLUE SHIELD | Admitting: Psychology

## 2017-08-25 ENCOUNTER — Ambulatory Visit: Payer: BLUE CROSS/BLUE SHIELD | Admitting: Psychology

## 2017-09-02 ENCOUNTER — Ambulatory Visit: Payer: BLUE CROSS/BLUE SHIELD | Admitting: Neurology

## 2017-09-13 ENCOUNTER — Ambulatory Visit (HOSPITAL_COMMUNITY)
Admission: RE | Admit: 2017-09-13 | Discharge: 2017-09-13 | Disposition: A | Discharge status: Home / Self Care | Payer: 59 | Attending: Psychiatry | Admitting: Psychiatry

## 2017-09-13 ENCOUNTER — Inpatient Hospital Stay (HOSPITAL_COMMUNITY)
Admission: RE | Admit: 2017-09-13 | Payer: Federal, State, Local not specified - Other | Source: Home / Self Care | Admitting: Psychiatry

## 2017-09-13 ENCOUNTER — Encounter (HOSPITAL_COMMUNITY): Payer: Self-pay | Admitting: *Deleted

## 2017-09-13 DIAGNOSIS — F329 Major depressive disorder, single episode, unspecified: Secondary | ICD-10-CM | POA: Diagnosis not present

## 2017-09-13 DIAGNOSIS — F419 Anxiety disorder, unspecified: Secondary | ICD-10-CM | POA: Diagnosis not present

## 2017-09-13 NOTE — BH Assessment (Signed)
Assessment Note  Brandon Kemp is an 42 y.o. male [facility] reporting that he was depressed and needed some counseling. Pt reported that he has had a lot of things going on here recently and that his depression has gotten worse. Pt reported that he was stressed out about his new job, his separation from his wife, and his two boys 7 and 9. Pt reported that he has struggled with depression for years and that he has tried several different medications which have not worked. Pt reported that Celexa, Lexapro, Prozac, and Zoloft all made his anxiety worst. Pt reported that he was recently started on Viibryd and that it was not really helping so today instead of taking 5mg  he took 10mg . Pt reported that the Viibryd 10mg  made him feel funny so that why [facility]. Pt reported that he sees Glenetta HewLisa Palosi for treatment, but has not also followed her medication regimen. Pt reported that most recently he was suppose to have a follow up appointment with Glenetta HewLisa Palosi but had to cancel due to work. Pt reported that he just wanted help with finding the right counselor and right treatment. Pt reported that at this time he was hopeless, but that he was a God fearing man and therefore would never do anything to hurt himself. Pt reported being negative SI/HI, no AH/VH. Pt reported that he just wanted to find a road to happiness.   Pt was seen by Alvy BealShavon Rankin NP, he was discharged home with ref[facility], [facility], and [facility]. Also this Clinical research associatewriter called current provider regarding today walk in, so that pt may be seen this week for medication magagement.      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 Chest pain    a. 09/2011 [facility]: Walked 13 mins, rare PVC's, couplet (symptomatic), no acute st/t changes.  . Congenital pulmonic valve stenosis    a. s/p surgical  repair @ Age 30 [place];  b.  12/12  Echo: EF 55-60%, nl wall motion, mild LVH, moderate PR.  Marland Kitchen. History of tobacco abuse   . Hypertension   . Symptomatic PVCs    a. 09/2011    Past Surgical History:  Procedure Laterality Date  . ANKLE SURGERY    . HAND SURGERY    . pulmonary outflow patch  age 17  . pulmonary valvotomy  age 17    Family History:  Family History  Problem Relation Age of Onset  . Evelene CroonWolff Parkinson White syndrome Brother   . Hypertension Father     Social History:  reports that he has quit smoking. He has never used smokeless tobacco. He reports that he does not drink alcohol or use drugs.  Additional Social History:  Alcohol / Drug Use Pain Medications: none Prescriptions: none Over the Counter: none History of alcohol / drug use?: No history of alcohol / drug abuse  CIWA:   COWS:    Allergies: No Known Allergies  Home Medications:  (Not in a hospital admission)  OB/GYN Status:  No LMP for male patient.  General Assessment Data TTS Assessment: In system Is this a Tele or Face-to-Face Assessment?: Face-to-Face Is this an Initial Assessment or a Re-assessment for this encounter?: Initial Assessment Marital status: Separated Is patient pregnant?: No Pregnancy Status: No Living Arrangements: Spouse/significant other, Parent Can pt  return to current living arrangement?: Yes Admission Status: Voluntary Is patient capable of signing voluntary admission?: Yes Referral Source: Self/Family/Friend Insurance type: Insurance account manager Exam [facility] Walk-in ONLY) Medical Exam completed: Yes  Crisis Care Plan Living Arrangements: Spouse/significant other, Parent Legal Guardian: Other:(self) Name of Psychiatrist: Glenetta Hew Name of Therapist: Ria Bush  Education Status Is patient currently in school?: No Is the patient employed, unemployed or receiving disability?: Employed  Risk to self with the past 6 months Suicidal Ideation: No Has  patient been a risk to self within the past 6 months prior to admission? : No Suicidal Intent: No Has patient had any suicidal intent within the past 6 months prior to admission? : No Is patient at risk for suicide?: No Suicidal Plan?: No-Not Currently/Within Last 6 Months Has patient had any suicidal plan within the past 6 months prior to admission? : No Access to Means: No What has been your use of drugs/alcohol within the last 12 months?: no Previous Attempts/Gestures: No How many times?: 0 Other Self Harm Risks: None Triggers for Past Attempts: None known Intentional Self Injurious Behavior: None Family Suicide History: No Recent stressful life event(s): Divorce Persecutory voices/beliefs?: No Depression: Yes Depression Symptoms: Feeling worthless/self pity Substance abuse history and/or treatment for substance abuse?: No Suicide prevention information given to non-admitted patients: Yes  Risk to Others within the past 6 months Homicidal Ideation: No Does patient have any lifetime risk of violence toward others beyond the six months prior to admission? : No Thoughts of Harm to Others: No Current Homicidal Intent: No Current Homicidal Plan: No Access to Homicidal Means: No Identified Victim: none History of harm to others?: No Assessment of Violence: None Noted Violent Behavior Description: none Does patient have access to weapons?: No Criminal Charges Pending?: No Does patient have a court date: No Is patient on probation?: No  Psychosis Hallucinations: None noted Delusions: None noted  Mental Status Report Appearance/Hygiene: Unremarkable Eye Contact: Good Motor Activity: Restlessness Speech: Logical/coherent Level of Consciousness: Alert Mood: Depressed Affect: Anxious Anxiety Level: Minimal Thought Processes: Coherent Judgement: Unimpaired Orientation: Person, Place, Time, Situation Obsessive Compulsive Thoughts/Behaviors: None  Cognitive  Functioning Concentration: Normal Memory: Recent Intact, Remote Intact Is patient IDD: No Is patient DD?: No Insight: Fair Impulse Control: Fair Appetite: Fair Have you had any weight changes? : No Change Sleep: Decreased Total Hours of Sleep: 5 Vegetative Symptoms: None  ADLScreening [facility] Assessment Services) Patient's cognitive ability adequate to safely complete daily activities?: Yes Patient able to express need for assistance with ADLs?: Yes Independently performs ADLs?: Yes (appropriate for developmental age)  Prior Inpatient Therapy Prior Inpatient Therapy: No  Prior Outpatient Therapy Prior Outpatient Therapy: Yes Prior Therapy Dates: Couple Weeks ago Prior Therapy Facilty/Provider(s): [facility] Hew Reason for Treatment: Depression  Does patient have an ACCT team?: No Does patient have Intensive In-House Services?  : No Does patient have [facility] services? : No Does patient have P4CC services?: No  ADL Screening (condition at time of admission) Patient's cognitive ability adequate to safely complete daily activities?: Yes Is the patient deaf or have difficulty hearing?: No Does the patient have difficulty seeing, even when wearing glasses/contacts?: No Does the patient have difficulty concentrating, remembering, or making decisions?: No Patient able to express need for assistance with ADLs?: Yes Does the patient have difficulty dressing or bathing?: No Independently performs ADLs?: Yes (appropriate for developmental age) Does the patient have difficulty walking or climbing stairs?: No Weakness of Legs: None Weakness of Arms/Hands:  None  Home Assistive Devices/Equipment Home Assistive Devices/Equipment: None  Therapy Consults (therapy consults require a physician order) PT Evaluation Needed: No OT Evalulation Needed: No SLP Evaluation Needed: No Abuse/Neglect Assessment (Assessment to be complete while patient is alone) Abuse/Neglect Assessment Can Be Completed:  Yes Physical Abuse: Denies Verbal Abuse: Denies Sexual Abuse: Denies Exploitation of patient/patient's resources: Denies Self-Neglect: Denies Values / Beliefs Cultural Requests During Hospitalization: None Spiritual Requests During Hospitalization: None Consults Spiritual Care Consult Needed: No Social Work Consult Needed: No Merchant navy officer (For Healthcare) Does Patient Have a Medical Advance Directive?: No Would patient like information on creating a medical advance directive?: No - Patient declined Nutrition Screen- [facility] Adult/WL/AP Patient's home diet: Regular Has the patient recently lost weight without trying?: No Has the patient been eating poorly because of a decreased appetite?: No Malnutrition Screening Tool Score: 0  Additional Information 1:1 In Past 12 Months?: No CIRT Risk: No Elopement Risk: No Does patient have medical clearance?: Yes     Disposition: Pt was discharged with referrals to [facility], [facility], and [facility]. Also current provider contacted for follow-up.  Disposition Initial Assessment Completed for this Encounter: Yes Disposition of Patient: Discharge Patient refused recommended treatment: No Mode of transportation if patient is discharged?: Car Patient referred to: Other (Comment)(Lisa palosi at [phone] current provider)  On Site Evaluation by:   Reviewed with Physician:    Buford Dresser 09/13/2017 4:56 PM

## 2017-09-13 NOTE — H&P (Signed)
Behavioral Health Medical Screening Exam  Brandon Kemp is an 42 y.o. male presents as walk in with complaints of depression and anxiety.  Patient currently has outpatient services but feels he needs to see someone different; looking for resources for outpatient psychiatric treatment and therapy.  Denies suicidal/self harm/homicidal ideations, psychosis, and paranoia  Total Time spent with patient: 45 minutes  Psychiatric Specialty Exam: Physical Exam  Constitutional: He is oriented to person, place, and time.  Neck: Normal range of motion. Neck supple.  Respiratory: Effort normal.  Musculoskeletal: Normal range of motion.  Neurological: He is alert and oriented to person, place, and time.  Skin: Skin is warm and dry.  Psychiatric: His behavior is normal. Judgment and thought content normal. His mood appears anxious. Cognition and memory are normal. He exhibits a depressed mood.    Review of Systems  All other systems reviewed and are negative.   There were no vitals taken for this visit.There is no height or weight on file to calculate BMI.  General Appearance: Casual and Neat  Eye Contact:  Good  Speech:  Clear and Coherent and Normal Rate  Volume:  Normal  Mood:  Appropriate  Affect:  Appropriate  Thought Process:  Coherent and Goal Directed  Orientation:  Full (Time, Place, and Person)  Thought Content:  Logical  Suicidal Thoughts:  No  Homicidal Thoughts:  No  Memory:  Immediate;   Good Recent;   Good Remote;   Good  Judgement:  Intact  Insight:  Present  Psychomotor Activity:  Normal  Concentration: Concentration: Good and Attention Span: Good  Recall:  Good  Fund of Knowledge:Good  Language: Good  Akathisia:  No  Handed:  Right  AIMS (if indicated):     Assets:  Communication Skills Desire for Improvement Housing Physical Health Resilience  Sleep:       Musculoskeletal: Strength & Muscle Tone: within normal limits Gait & Station: normal Patient leans:  N/A  There were no vitals taken for this visit.  Recommendations:  Outpatient psychiatric services.  Resource information given Recommendations:  Disposition: No evidence of imminent risk to self or others at present.   Patient does not meet criteria for psychiatric inpatient admission.  Discussed crisis plan, support from social network, calling 911, coming to the Emergency Department, and calling Suicide Hotline.   Based on my evaluation the patient does not appear to have an emergency medical condition.  Shuvon Rankin, NP 09/13/2017, 4:36 PM

## 2017-09-26 ENCOUNTER — Telehealth: Payer: Self-pay

## 2017-09-26 MED ORDER — LOSARTAN POTASSIUM 100 MG PO TABS: 100.0000 mg | ORAL_TABLET | Freq: Every day | ORAL | 11 refills | Status: DC

## 2017-09-26 NOTE — Telephone Encounter (Signed)
Per Dr. Excell Seltzerooper, instructed patient to INCREASE LOSARTAN to 100 mg daily. Scheduled patient for overdue follow-up 8/26. Patient was grateful for call and agrees with treatment plan.

## 2017-10-27 ENCOUNTER — Encounter: Payer: Self-pay | Admitting: Cardiovascular Disease

## 2017-10-27 ENCOUNTER — Ambulatory Visit: Payer: 59 | Admitting: Cardiovascular Disease

## 2017-10-27 DIAGNOSIS — I10 Essential (primary) hypertension: Secondary | ICD-10-CM | POA: Diagnosis not present

## 2017-10-27 DIAGNOSIS — I371 Nonrheumatic pulmonary valve insufficiency: Secondary | ICD-10-CM

## 2017-10-27 MED ORDER — ATORVASTATIN CALCIUM 10 MG PO TABS: 10.0000 mg | ORAL_TABLET | Freq: Every day | ORAL | 3 refills | Status: DC

## 2017-10-27 MED ORDER — LOSARTAN POTASSIUM 100 MG PO TABS: 100.0000 mg | ORAL_TABLET | Freq: Every day | ORAL | 3 refills | Status: DC

## 2017-10-27 NOTE — Patient Instructions (Signed)
Medication Instructions:  Your provider recommends that you continue on your current medications as directed. Please refer to the Current Medication list given to you today.    Labwork: Your provider recommends that you return for FASTING lab work prior to your appointment in 1 year.    Testing/Procedures: None  Follow-Up: Your provider wants you to follow-up in: 1 year with Dr. Excell Seltzerooper. You will receive a reminder letter in the mail two months in advance. If you don't receive a letter, please call our office to schedule the follow-up appointment.    Any Other Special Instructions Will Be Listed Below (If Applicable).     If you need a refill on your cardiac medications before your next appointment, please call your pharmacy.

## 2017-10-27 NOTE — Progress Notes (Signed)
Cardiology Office Note Date:  10/27/2017   ID:  Brandon Kemp, DOB [birth year], MRN [id number]  PCP:  Patient, No Pcp Per  Cardiologist:  Tonny BollmanMichael Shaylie Eklund, MD    Chief Complaint  Patient presents with  . Hypertension     History of Present Illness: Brandon Kemp is a 42 y.o. male who presents for follow-up of hypertension.  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 An echocardiogram in 2013 showed normal left ventricular function, mild LVH, and moderate pulmonic regurgitation without significant pulmonic stenosis. He's undergone exercise stress testing that showed good exercise tolerance without significant ST or T-wave changes. Because of frequent palpitations, he underwent a Holter monitor showing rare PVCs. There is no sustained arrhythmias or significant pauses. A renal arterial duplex in 2013 was normal with no evidence of renal artery stenosis. 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   The patient is here alone today.  He has had some problems with elevated blood pressures based on his home readings.  Blood pressure is improved since he has started back on losartan again.  He has not been seen here since 2016.  He has been under a lot of personal stress.  He denies chest pain, shortness of breath, or heart palpitations.  Past Medical History:  Diagnosis Date  . Chest pain    a. 09/2011 ETT @ [facility]: Walked 13 mins, rare PVC's, couplet (symptomatic), no acute st/t changes.  . Congenital pulmonic valve stenosis    a. s/p surgical repair @ Age 39 in [place];  b.  12/12  Echo: EF 55-60%, nl wall motion, mild LVH, moderate PR.  Marland Kitchen. History of tobacco abuse   . Hypertension   . Symptomatic PVCs    a. 09/2011    Past Surgical History:  Procedure Laterality Date  . ANKLE SURGERY    . HAND SURGERY    . pulmonary outflow patch  age 70  . pulmonary valvotomy  age 70    Current Outpatient Medications    Medication Sig Dispense Refill  . ALPRAZolam (XANAX) 0.5 MG tablet TAKE 1 TABLET 3 TIMES A DAY AS NEEDED FOR ANXIETY. MAY TAKE 1/2 TABLET IF EFFECTIVE  0  . atorvastatin (LIPITOR) 10 MG tablet Take 1 tablet (10 mg total) by mouth daily. 90 tablet 3  . losartan (COZAAR) 100 MG tablet Take 1 tablet (100 mg total) by mouth daily. 90 tablet 3  . methylphenidate (RITALIN LA) 10 MG 24 hr capsule     . venlafaxine XR (EFFEXOR-XR) 75 MG 24 hr capsule TAKE ONE CAPSULE BY MOUTH EVERY MORNING WITH FOOD  1  . Desvenlafaxine Succinate (PRISTIQ PO) Take 50 mg by mouth daily.     No current facility-administered medications for this visit.     Allergies:   Patient has no known allergies.   Social History:  The patient  reports that he has quit smoking. He has never used smokeless tobacco. He reports that he does not drink alcohol or use drugs.   Family History:  The patient's  family history includes Hypertension in his father; Evelene CroonWolff Parkinson White syndrome in his brother.    ROS:  Please see the history of present illness.  Otherwise, review of systems is positive for depression/anxiety.  All other systems are reviewed and negative.   PHYSICAL EXAM: VS:  BP 130/74   Pulse 73   Ht 5' 10.5" (1.791 m)   Wt  191 lb 12.8 oz (87 kg)   SpO2 97%   BMI 27.13 kg/m  , BMI Body mass index is 27.13 kg/m. GEN: Well nourished, well developed, in no acute distress  HEENT: normal  Neck: no JVD, no masses. No carotid bruits Cardiac: RRR with 2/6 systolic ejection murmur at the left upper sternal border, no diastolic murmur     respiratory:  clear to auscultation bilaterally, normal work of breathing GI: soft, nontender, nondistended, + BS MS: no deformity or atrophy  Ext: no pretibial edema, pedal pulses 2+= bilaterally Skin: warm and dry, no rash Neuro:  Strength and sensation are intact Psych: euthymic mood, full affect  EKG:  EKG is ordered today. The ekg ordered today shows normal sinus rhythm 73 bpm,  biatrial enlargement, possible RVH  Recent Labs: No results found for requested labs within last 8760 hours.   Lipid Panel     Component Value Date/Time   CHOL 155 07/23/2016 0839   TRIG 46 07/23/2016 0839   HDL 60 07/23/2016 0839   CHOLHDL 2.6 07/23/2016 0839   CHOLHDL 5 09/09/2014 0902   VLDL 23.6 09/09/2014 0902   LDLCALC 86 07/23/2016 0839   LDLDIRECT 155.7 09/26/2011 1031      Wt Readings from Last 3 Encounters:  10/27/17 191 lb 12.8 oz (87 kg)  03/05/17 178 lb 9.6 oz (81 kg)  02/01/15 205 lb (93 kg)     ASSESSMENT AND PLAN: 1.  Hypertension: Blood pressure is well controlled based on today's office reading, currently treated with losartan.  He has had some elevated blood pressure readings at home.  I have asked him to keep his eye on blood pressure and if he is seen frequent readings greater than 150/90 I asked him to call me and we will increase his medication.  We discussed a low-sodium diet today.  2.  Pulmonic stenosis: Patient status post surgical repair.  Last echo study demonstrated moderate pulmonic regurgitation with no significant pulmonic stenosis.  He is had no other valvular disease.  Current medicines are reviewed with the patient today.  The patient does not have concerns regarding medicines.  Labs/ tests ordered today include:   Orders Placed This Encounter  Procedures  . Comprehensive metabolic panel  . Lipid panel  . EKG 12-Lead    Disposition:   FU 1 year  Signed, [name], MD  10/27/2017 5:33 PM    [facility] [address] [place], [place], [place]  [address] Phone: [phone]; Fax: [phone]

## 2017-12-07 ENCOUNTER — Encounter: Payer: Self-pay | Admitting: Psychiatry

## 2017-12-07 ENCOUNTER — Telehealth: Payer: Self-pay | Admitting: Psychiatry

## 2017-12-07 DIAGNOSIS — F909 Attention-deficit hyperactivity disorder, unspecified type: Secondary | ICD-10-CM | POA: Insufficient documentation

## 2017-12-07 DIAGNOSIS — F4323 Adjustment disorder with mixed anxiety and depressed mood: Secondary | ICD-10-CM | POA: Insufficient documentation

## 2017-12-07 DIAGNOSIS — Z630 Problems in relationship with spouse or partner: Secondary | ICD-10-CM | POA: Insufficient documentation

## 2017-12-07 HISTORY — DX: Problems in relationship with spouse or partner: Z63.0

## 2017-12-07 NOTE — Telephone Encounter (Signed)
Returned phone message from PT asking for appointment to be opened for him and asking advice regarding interacting with wife, kids, inlaws, as separation continues, and mediation approaches.  Having new thoughts of possibly trying to reconcile with wife, but it is ill-defined and does not include any plan for how to manage conflict fairly.  Considering whether to approach inlaws with word of wife's suspected emotional and psychiatric disturbance, including playing audio recordings her extended cursing jags and trying to force him to declare himself immoral to his children.  Advised it would only backfire, making him look manipulative, and that best advice is to follow through with dinner with children and inlaws (presently there, invited by inlaws), approach all things positively, spend quality time with both as able, and consult attorney before any attempts to persuade wife or her family members.  Will instruct staff to open up an appointment opportunity where feasible.

## 2018-01-06 ENCOUNTER — Ambulatory Visit (INDEPENDENT_AMBULATORY_CARE_PROVIDER_SITE_OTHER): Payer: 59 | Admitting: Psychiatry

## 2018-01-06 DIAGNOSIS — F4323 Adjustment disorder with mixed anxiety and depressed mood: Secondary | ICD-10-CM | POA: Diagnosis not present

## 2018-01-06 DIAGNOSIS — F902 Attention-deficit hyperactivity disorder, combined type: Secondary | ICD-10-CM | POA: Diagnosis not present

## 2018-01-06 NOTE — Progress Notes (Signed)
[facility] Counselor/Therapist Progress Note  Patient ID: Brandon Kemp  MRN: [id number] Date: 01/06/2018  Start time: 8:18a     Stop time: 9:15a Time Spent: 57 min  Treatment Type: Individual  Self-Report:  Continues to deal with unwanted, painful, anxiety-provoking separation and very restrained contact with children.   Brandon Kemp asking for money, repeated claims he unfairly raided their shared bank account when he unilaterally removed half the funds.  Continued blame for listening to his parent and brother about her, blame for recording her in tirades, balking about cost-saving change to the phone plan.  Worries both for her welfare and the boys'.  Had one mediation meeting, no working agreement on visitation.  Opposing attorney claims to have recording of PT's father cursing on the phone, demeaning wife, wife frequently references them badgering PT, controlling PT, and says he has a 2016 recording of PT using the F word.  Continues to live, uncomfortably, with his parents, to spare costs, as parents and brother accuse PT of Stockholm Syndrome for wanting to work it out with [name].  Continue to maintain both that she has been harsh and forbidding and that he wants to work it out with her, out of conviction that he is supposed to accept her unconditionally according to his [place] wedding vows and that he is uncomfortable with the family pressure.  Recently tried to tell her he and brother got into an argument, hoping to show her he is trying to be fair to her and represent her, but it was only met with more bile against his family and condemnation of himself.  History of telling Brandon Kemp he was not sure he could handle adult responsibilities, uttered at a time when he was suffering from untreated ADHD and anxiety and depression altogether and felt terrifically guilty for a nonsexual affair.  Clear that he is considerably better now and can, if necessary handle both living single and adult  responsibilities.  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  Interventions: Assertiveness/Communication, Ego-Supportive, Insight-Oriented and Social Problem-Solving Reviewed attributions about estranged wife, differentiated from more hostile interpretations of family while upholding basic premise that wife has significant issues with control and feeling controlled, is by report too hostile and double-minded to have hope of reconciling by trying to offer concessions.  No realistic hope of reconciling and being able to sustain a healthy home together unless she acknowledges and manages her own anger and control issues.  Bolstered with recognition and hope of the boys seeing each parent's behavior, indicating interest in seeing PT, and eventual ability to speak for themselves.  Agree with attorney's assessment that the mediation effort is being stonewalled either in retribution toward PT or to condition him to submit again, and perhaps to wedge the children away from their own attachment to PT.  Advised against any attempt to lobby or educate her about his true heart, motives, intentions, etc., as it is clear she has pegged him as the enemy and any attempt to persuade or "sell" will be taken as enemy action or manipulation.  Recommended instead stick to the broken-record approach of asking his due for visitation, with how and when questions, acknowledging side points and quibbling where possible and returning to the subject.  Mental Status Exam:    Appearance:   Neat and Well Groomed     Behavior:  Appropriate and Sharing  Motor:  Normal  Speech/Language:   Clear  and Coherent  Affect:  Appropriate, Depressed and Tearful  Mood:  dysthymic  Thought process:  normal  Thought content:    WNL  Sensory/Perceptual disturbances:    WNL  Orientation:  WNL  Attention:  Good  Concentration:  Good  Memory:  WNL  Fund of knowledge:    Good  Insight:    Fair  Judgment:   Good  Impulse Control:  Good     Risk Assessment: Danger to Self:  No Self-injurious Behavior: No Danger to Others: No Duty to Warn:no Physical Aggression / Violence:No  Access to Firearms a concern: No  Gang Involvement:No   Diagnosis:   ICD-10-CM   1. Adjustment disorder with mixed anxiety and depressed mood F43.23   2. Attention deficit hyperactivity disorder (ADHD), combined type F90.2     Plan:  . Use advice on communication and anti-manipulation strategies . Continue to utilize previously learned skills ad lib . Maintain medication, if prescribed, and work faithfully with relevant prescriber(s) . Call the clinic on-call service, present to ER, or call 911 if any life-threatening emergency . Follow up with me in about 2 weeks    Blanchie Serve, PhD

## 2018-01-19 ENCOUNTER — Ambulatory Visit (INDEPENDENT_AMBULATORY_CARE_PROVIDER_SITE_OTHER): Payer: 59 | Admitting: Psychiatry

## 2018-01-19 DIAGNOSIS — F4323 Adjustment disorder with mixed anxiety and depressed mood: Secondary | ICD-10-CM

## 2018-01-19 DIAGNOSIS — F902 Attention-deficit hyperactivity disorder, combined type: Secondary | ICD-10-CM

## 2018-01-19 NOTE — Progress Notes (Signed)
Psychotherapy Progress Note -- Brandon CzarAndy Tatem Holsonback, PhD, [facility]  Patient ID: Brandon FormosaDavid R Dugdale Kemp MRN: [id number] Date: 01/19/2018 Treatment Type: Individual Start: 10:16a Stop: 11:19a Time Spent: 63 min Accompanied by: none  Self-Report (interim history, self-report of stressors and symptoms, application of prior therapy, status changes) In better spirits, better mental energy since going on Ritalin (20mg , Brandon SavageLisa Kemp).  Effexor XR 75mg  --> 100mg  1 month ago.  Feels better authority to deal with estranged wife, more determined that she will have to find work regardless of whether they stay together or not, since financial responsibility has been lopsided throughout their relationship.  Still nervous about the job, which involves travel.  Sleep pattern hard to manage, still slow to rise.  Got up 8am today.  Ideally 6am.  Less frightened of possibly winding up single.  Has enjoyed being allowed more time with sons and more time in the family home.  Sees Brandon Kemp talking more like it's possible to reconcile, which also is welcome.  Clearer that both nee the right to speak and be heard and to participate in decision-making, rather than continue to endure repeated blame for past mistakes and be treated like he is invalid as a parent, "the" problem, abandoner, etc.  Concerned that Brandon Kemp bought a dog without consultation, as it will be further expense and continues a pattern of spending without partnership, but thinks the dog could be a calming influence for both Brandon Kemp and their more anxious son.  A few days of sertraline has also helped quiet thoughts of whether he should live or not.  Still fears mediation, but has refused to unilaterally put back money demanded by wife as a precondition for discussing terms.  This weekend looks forward to a workshop in [place] on living as a [place] man.  Therapies used: Cognitive Behavioral Therapy and Assertiveness/Communication  Intervention notes: Discussed  standards and areas for confrontation, if sees fit.  Framed conciliatory assertiveness message acknowledging history of giving in and putting his own assertive stances in the frame of Brandon Kemp saying she wants him to be "independent", i.e., if he is to be truly independent, it does mean he will differ with her sometime, and he mean it to be on principle [employer](fiscal responsibility, best interest of the kids, etc.)  Also framing fiscal responsibility as an act of love, toward all concerned.    Mental Status/Observations:     Appearance:   Casual     Behavior:  Appropriate  Motor:  Normal  Speech/Language:   Clear and Coherent  Affect:  brighter  Mood:  anxious  Thought process:  normal  Thought content:    WNL  Sensory/Perceptual disturbances:    WNL  Orientation:  WNL  Attention:  Good  Concentration:  Good  Memory:  WNL  Fund of knowledge:   Fair  Insight:    Fair  Judgment:   Good  Impulse Control:  Good   Risk Assessment: Danger to Self:  No Self-injurious Behavior: No Danger to Others: No Duty to Warn:no Physical Aggression / Violence:No  Access to Firearms a concern: No   Diagnosis:   ICD-10-CM   1. Adjustment disorder with mixed anxiety and depressed mood F43.23   2. Attention deficit hyperactivity disorder (ADHD), combined type F90.2     Progress rating:  Moderately improved  Plan:  . Practice balanced, assertive messages -- grant a pattern, grant a feeling, assert a principle . Continue to utilize previously learned skills ad lib . Maintain medication, if prescribed,  and work faithfully with relevant prescriber(s) . Call the clinic on-call service, present to ER, or call 911 if any life-threatening emergency . Follow up with me in about 2 weeks  Brandon Fries, PhD

## 2018-01-27 ENCOUNTER — Ambulatory Visit: Payer: 59 | Admitting: Psychiatry

## 2018-01-27 DIAGNOSIS — F4323 Adjustment disorder with mixed anxiety and depressed mood: Secondary | ICD-10-CM | POA: Diagnosis not present

## 2018-01-27 DIAGNOSIS — F902 Attention-deficit hyperactivity disorder, combined type: Secondary | ICD-10-CM

## 2018-01-27 NOTE — Progress Notes (Signed)
Psychotherapy Progress Note -- Brandon Moore, PhD, [facility]  Patient ID: OREE HISLOP     MRN: [id number]     Date: 01/27/2018     Treatment Type: Individual psychotherapy Start: 8:21a Stop: 9:22a Time Spent: 61 min Accompanied by: none  Self-Report (interim history, self-report of stressors and symptoms, application of prior therapy, status changes) Went to a [employer] for [employer] this weekend, identified shame and anger issues, freeing.  Feedback from friends validates perspective that [name] is narcissistic, though he has some recent acknowledgment from her that she has anger issues.    Parents can be negative about [name] but making shorter work of it.  Personally, spirits better, energy better, less anxiety about work, hope of travelling less and seeing children more.  No SI.  Still anxious about conflict, including legal mediation.  Therapies used: Assertiveness/Communication  Intervention notes: Discussed communications at length.  Challenged to move conversations toward shared standards of fairness, specificity in the things she asks, and clarity in the things he asks.  Recast concept of wife from Narcissistic PD to more immature and addicted to emotional expression and having some things come easy to her, not NPD per se.  Modeled assertiveness principles.  Encouraged stay ready to tell all parties -- family and friends included -- that he has to make his own decisions and for his own integrity's sake try the constructive route where possible.    Mental Status/Observations:     Appearance:   Casual and Neat     Behavior:  Appropriate  Motor:  Normal  Speech/Language:   Clear and Coherent  Affect:  Appropriate  Mood:  anxious  Thought process:  normal  Thought content:    WNL  Sensory/Perceptual disturbances:    WNL  Orientation:  WNL  Attention:  Good  Concentration:  Good  Memory:  WNL  Fund of knowledge:   Fair  Insight:    Fair  Judgment:    Good  Impulse Control:  Fair and susceptible to manipulation when anxious   Risk Assessment: Danger to Self:  No Self-injurious Behavior: No Danger to Others: No Duty to Warn:no Physical Aggression / Violence:No  Access to Firearms a concern: No   Diagnosis:   ICD-10-CM   1. Adjustment disorder with mixed anxiety and depressed mood F43.23   2. Attention deficit hyperactivity disorder (ADHD), combined type F90.2    Progress rating:  stable  Plan:  . Continue visiting children and contributing to the family account as appropriate . Continue discussing terms of reconciliation, ready to assert independence with all parties . Continue to utilize previously learned skills ad lib . Maintain medication, if prescribed, and work faithfully with relevant prescriber(s) . Call the clinic on-call service, present to ER, or call 911 if any life-threatening emergency . Follow up with me in about 2 weeks  Blanchie Serve, PhD

## 2018-02-05 ENCOUNTER — Ambulatory Visit (INDEPENDENT_AMBULATORY_CARE_PROVIDER_SITE_OTHER): Payer: 59 | Admitting: Psychiatry

## 2018-02-05 DIAGNOSIS — F4323 Adjustment disorder with mixed anxiety and depressed mood: Secondary | ICD-10-CM | POA: Diagnosis not present

## 2018-02-05 DIAGNOSIS — Z630 Problems in relationship with spouse or partner: Secondary | ICD-10-CM | POA: Diagnosis not present

## 2018-02-05 DIAGNOSIS — F324 Major depressive disorder, single episode, in partial remission: Secondary | ICD-10-CM

## 2018-02-05 DIAGNOSIS — F902 Attention-deficit hyperactivity disorder, combined type: Secondary | ICD-10-CM | POA: Diagnosis not present

## 2018-02-05 NOTE — Progress Notes (Signed)
Psychotherapy Progress Note -- Luan Moore, PhD, [facility]  Patient ID: Brandon Kemp     MRN: [id number]     Date: 02/05/2018    Therapy format: Individual psychotherapy Start: 1:11p Stop: 2:11p Time Spent: 60 min Accompanied by: none  Session narrative -- interim history, self-report of stressors and symptoms, applications of prior therapy, status changes, and interventions in session Left wallet and backpack in [place] last night.  Met today with child counselor, then had lunch with youngest at school.  Mediation next Friday.  Positive time with wife and the kids [place], was allowed to go hiking with them, wife began to pitch reconciliation.  Back in town, more dysphoric, but some hope.  Talked to parents today, M in particular, about doing some personal challenge, therapy if possible, to settle their resentments and mistrust and need to comment and direct him in his choices about the relationship.  Has been getting articles fed to him about narcissism since looking it up online, starting to get uncomfortable.  Addressed question of including wife in therapy.  Hesitant, but open to engaging.  Oriented to legalities of either party seeking couples counseling and how it voids any ability to call therapists to court -- agrees.  Oriented to model of therapy where each is welcome as a guest in the other's therapy while retaining ownership over their own therapy goals, relationship, and boundaries.  Only stricture is that cannot simply swap in wife for a session he schedules, both because it would have been short notice and because it would have forced either an out-of-pocket cost (FT w/o PT) or forced taking W on as a new PT, which has other responsibilities.    Modelled constructive responses to parents.  Recommend turn off his phone news feed for articles on narcissism.  In dealing with [name], if she is looking defensive, stop trying to explain things as  quickly as possible, as explaining only annoys an emotionally uncomfortable listener.  Instead, try to table the discussion or ask what she needs to have happen at the moment.  Framed best-odds way to start a confrontation: 1. Single topic, 2. Calm self, 3. Consent to a hard conversation, and 4. More questions than pushing points until you find enough agreement about facts to state a difference.  Therapeutic modalities: Cognitive Behavioral Therapy, Assertiveness/Communication and Solution-Oriented/Positive Psychology  Mental Status/Observations:  Appearance:   Casual and Neat     Behavior:  Appropriate  Motor:  Normal  Speech/Language:   Clear and Coherent  Affect:  Appropriate  Mood:  anxious  Thought process:  normal  Thought content:    WNL  Sensory/Perceptual disturbances:    WNL  Orientation:  WNL  Attention:  Good  Concentration:  Good  Memory:  WNL  Insight:    Fair  Judgment:   Good  Impulse Control:  Fair and susceptible to pressure   Risk Assessment: Danger to Self:  No Self-injurious Behavior: No Danger to Others: No Duty to Warn:no Physical Aggression / Violence:No  Access to Firearms a concern: No   Diagnosis:   ICD-10-CM   1. Adjustment disorder with mixed anxiety and depressed mood F43.23   2. Attention deficit hyperactivity disorder (ADHD), combined type F90.2   3. 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 Relationship problem between partners Z63.0     Assessment of progress:  stable  Plan:  . Continue negotiations with wife,  address tactical and legal questions to attorney . Open to conjoint therapy if needed and seeing hope of an amicable solution . Continue to utilize previously learned skills ad lib . Maintain medication, if prescribed, and work faithfully with relevant prescriber(s) . Call the clinic on-call service, present to ER, or call 911 if any life-threatening emergency . Follow up with me in about  2 weeks  Blanchie Serve, PhD

## 2018-02-11 ENCOUNTER — Ambulatory Visit: Payer: 59 | Admitting: Psychiatry

## 2018-02-19 ENCOUNTER — Ambulatory Visit (INDEPENDENT_AMBULATORY_CARE_PROVIDER_SITE_OTHER): Payer: 59 | Admitting: Psychiatry

## 2018-02-19 DIAGNOSIS — F324 Major depressive disorder, single episode, in partial remission: Secondary | ICD-10-CM | POA: Diagnosis not present

## 2018-02-19 DIAGNOSIS — F4323 Adjustment disorder with mixed anxiety and depressed mood: Secondary | ICD-10-CM | POA: Diagnosis not present

## 2018-02-19 DIAGNOSIS — Z630 Problems in relationship with spouse or partner: Secondary | ICD-10-CM

## 2018-02-19 DIAGNOSIS — F902 Attention-deficit hyperactivity disorder, combined type: Secondary | ICD-10-CM
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
## 2018-03-09 ENCOUNTER — Ambulatory Visit (INDEPENDENT_AMBULATORY_CARE_PROVIDER_SITE_OTHER): Payer: 59 | Admitting: Psychiatry

## 2018-03-09 DIAGNOSIS — F324 Major depressive disorder, single episode, in partial remission: Secondary | ICD-10-CM | POA: Diagnosis not present

## 2018-03-09 DIAGNOSIS — F4323 Adjustment disorder with mixed anxiety and depressed mood: Secondary | ICD-10-CM

## 2018-03-09 DIAGNOSIS — F902 Attention-deficit hyperactivity disorder, combined type: Secondary | ICD-10-CM

## 2018-03-09 DIAGNOSIS — Z630 Problems in relationship with spouse or partner: Secondary | ICD-10-CM

## 2018-03-09 NOTE — Progress Notes (Signed)
Psychotherapy Progress Note -- Brandon Czar, PhD, [facility]  Patient ID: Brandon Kemp     MRN: [id number]     Date: 03/09/2018     Therapy format: Individual psychotherapy Start: 11:28a Stop: 12:19p Time Spent: 51 min Accompanied by: none  Session narrative -- interim history, self-report of stressors and symptoms, applications of prior therapy, status changes, and interventions in session Word received running late.  Has seen more of the kids, staying over some nights (not sharing bed).  Has visited her family in [place], relatively comfortably, though she still remans averse to his family/friends.  W reacted hard to finding out he was on phone with a friend who was part of encouraging him to leave this summer and she made PT leave early.  Had an agreement for her to bring the kids to his parents for 2-3 hrs then she went back on it, saying she couldn't trust them with the boys, couldn't do that.  Brandon Kemp still standing on principle, too, that he has to put all the money back before they can work out custody or reconciliation, but PT says he can't trust her not to spend on vacations and luxuries instead.  Unfortunately, father sent him a Bible reference how a woman should be obedient to her husband as to God, which he just left sit; Brandon Kemp saw it, renewed antipathy toward his father, Brandon Kemp al.  It's all feeling more impossible, wondering whether he should just surrender, put all the money under her control, move back in.  Could not conscience agreeing with her that his father is "toxic" and the thing to do would be to wall off his family but feels that that is what she actually wants and at least subtly demands.  Atty says if she wins child support plus alimony, it will be almost his entire take-home pay.  Validated stress of these dilemmas, endorsed the reality that they cannot hope to have a marriage unless they can establish fair play, which includes both having opinions that can be heard by  the other, some degree of compromise, sticking to one subject until a mutual understanding, and no "hostage-taking".  Endorsed that either can choose not to associate with the other's family/friends but they have to both grant each other to have them.  Encouraged to redirect further disparagement of his family -- it's not not about them, it's about the fact that he sided with them in leaving.  He can wish he'd done it differently, but the issues that actually separate them remain.  Pushed to de-link money and child care -- says Brandon Kemp is making child decisions contingent on returning the money.  Both are sets of responsibilties that the couple share, but one can be dealt with in the absence of the other.  Framed possible ways to put the points, highlighting sticking to one subject, granting the negative instance, clarifying whether she is demanding something, and upholding the principle that they may agree to disagree.    Also framed wife's difficulties as more anxiety-based than truly narcissistic, trouble being that she is so accustomed to being able to manage his behavior to relieve her anxiety that it acts like a drug addiction and comes off coercive enough to tempt supportive bystanders to read her as narcissistic.  Remains on both a stimulant and Xanax, as well as antidepressant.  Dicussed briefly would rather take more Effexor if it can lessen anxiety than risk arms race between stimulant and sedative.  Agreed.  Possible  to refer back to psychiatry, saw Brandon Kemp for a while, but need to hold down costs sent him to PCP.  Therapeutic modalities: Cognitive Behavioral Therapy, Assertiveness/Communication and Solution-Oriented/Positive Psychology  Mental Status/Observations:  Appearance:   Casual, Neat and Well Groomed     Behavior:  Appropriate  Motor:  Normal and some blinking, since stimulant  Speech/Language:   Clear and Coherent  Affect:  Appropriate and tense  Mood:  anxious  Thought  process:  worrisome  Thought content:    worry  Sensory/Perceptual disturbances:    WNL  Orientation:  WNL  Attention:  Good  Concentration:  Good and  on stim  Memory:  WNL  Insight:    Fair  Judgment:   Good  Impulse Control:  Good   Risk Assessment: Danger to Self:  No Self-injurious Behavior: No Danger to Others: No Duty to Warn:no Physical Aggression / Violence:No  Access to Firearms a concern: No   Diagnosis:   ICD-10-CM   1. Adjustment disorder with mixed anxiety and depressed mood F43.23   2. Attention deficit hyperactivity disorder (ADHD), combined type F90.2   3. Relationship problem between partners Z63.0   4. Major depressive disorder with single episode, in partial remission (HCC) F32.4     Assessment of progress:  stable  Plan:  . Practice fair play principles as noted above . Decide on a regular amount to send wife so she doesn't have to ask for everything . Be ready to own it that he only partly trusts her managing money . Continue to utilize previously learned skills ad lib . Maintain medication, if prescribed, and work faithfully with relevant prescriber(s) . Call the clinic on-call service, present to ER, or call 911 if any life-threatening emergency . Follow up with me in about 2 weeks  Brandon Fries, PhD [place] Licensed Psychologist

## 2018-03-17 ENCOUNTER — Ambulatory Visit: Payer: 59 | Admitting: Psychiatry

## 2018-03-17 DIAGNOSIS — F902 Attention-deficit hyperactivity disorder, combined type: Secondary | ICD-10-CM | POA: Diagnosis not present

## 2018-03-17 DIAGNOSIS — F324 Major depressive disorder, single episode, in partial remission: Secondary | ICD-10-CM | POA: Diagnosis not present

## 2018-03-17 DIAGNOSIS — F4323 Adjustment disorder with mixed anxiety and depressed mood: Secondary | ICD-10-CM

## 2018-03-17 DIAGNOSIS — Z630 Problems in relationship with spouse or partner: Secondary | ICD-10-CM

## 2018-03-17 NOTE — Progress Notes (Signed)
Psychotherapy Progress Note [facility]  Patient ID: Brandon Kemp     MRN: [id number]      Date: 03/17/18     Start: 9:13a Stop: 10:20a Time Spent: 67 min Therapy format: Family therapy w/ patient -- accompanied by wife Brandon Kemp  Session narrative -- presenting needs, interim history, self-report of stressors and symptoms, applications of prior therapy, status changes, and interventions in session Wife expected by PT but not present at start time.  Says he has been staying at the house through the holidays, and she still wants him to sign off a document that she gets all equity in the house in the event he leaves again, as a post-nup contract.  His atty says don't sign, reasonably, as this frames an "owned" relationship rather than partners.  Agreed, and recommended PT assess and commit to a dedicated amount of financial support as both a responsibility to his family and a gesture of trustworthiness for a spouse easily hypnotized by the appearance of lacking security.    Anxious about the job, unsure he can sustain it.  Unable to ascertain particular concerns except travel time and managing time expectations.    Father keeps sending uplifting verses accompanied by directly and indirectly critical comments about wife, which she sees as evil and meddlesome and intensifies her mistrust of him.  Wife joins session, confirms this.  Advice to PT ask his father to refrain from lobbying him, let him know it only makes his own task harder; advice to W to try to take her adversary less seriously, since we established earlier that it is only her husband's behavior that actually matters for the marriage.  W vented about   Issue of a farm PT and brother bought, and obligations to it.   Discussed differences between love and trust, trying to get  W comments, seeming to take more of a victim posture, that it makes her sad for PT to be so unable to show her trust.  Little time available, but  attempted to reframe for both.  Difficult to do, with multiple subjects and quick changes of emphasis from wife.  Mediation anticipated next week.  Encouraged both to stay practical, be clear as possible about what they ask and why, listen openly to counteroffers, allow the other to have their own interests and points to make, and don't read in much.  Therapeutic modalities: Cognitive Behavioral Therapy, Assertiveness/Communication, Ego-Supportive and Family Systems  Mental Status/Observations:  Appearance:   Casual and Well Groomed     Behavior:  Appropriate  Motor:  Normal and mild facia tics attrib to stimulant + situational anxiety  Speech/Language:   Clear and Coherent and excep halting under pressure  Affect:  Appropriate and anxious  Mood:  anxious  Thought process:  normal  Thought content:    WNL  Sensory/Perceptual disturbances:    WNL  Orientation:  WNL  Attention:  Good  Concentration:  Fair  Memory:  WNL  Insight:    Fair  Judgment:   Good  Impulse Control:  Fair   Risk Assessment: Danger to Self:  No Self-injurious Behavior: No Danger to Others: No Duty to Warn:no Physical Aggression / Violence:No  Access to Firearms a concern: No   Diagnosis:   ICD-10-CM   1. Major depressive disorder with single episode, in partial remission (HCC) F32.4   2. Adjustment disorder with mixed anxiety and depressed mood F43.23   3. Relationship problem between partners Z63.0   4. Attention deficit  hyperactivity disorder (ADHD), combined type F90.2     Assessment of progress:  improving  Plan:  . Recommendations/advice as noted above, particularly o advocate with father to back off sending divisive messages, allow PT to use his own discretion o Wife exercise patience and trust in letting that happen w/o urgency, badgering, or repetitious comments o Continue to recommend establishing a known amount of financial support, to underscore his constructive motives and reliability as a  provider, and to signal lower threat to his very anxious and defensive wife and reduce cause for her to demonize him to the kids should she be tempted further o Agree with attorney that signing a blanket postnup would not only abrogate his rights but validate the kind of one-up relationship he particularly wants wife to collaborate in ending and preventing . Continue to utilize previously learned skills ad lib . Maintain medication, if prescribed, and work faithfully with relevant prescriber(s) . Call the clinic on-call service, present to ER, or call 911 if any life-threatening emergency . Follow up with me in about 1 wk, as able   Robley Fries, PhD [place] Licensed Psychologist

## 2018-03-31 ENCOUNTER — Ambulatory Visit (INDEPENDENT_AMBULATORY_CARE_PROVIDER_SITE_OTHER): Payer: 59 | Admitting: Psychiatry

## 2018-03-31 DIAGNOSIS — F4322 Adjustment disorder with anxiety: Secondary | ICD-10-CM

## 2018-03-31 DIAGNOSIS — Z630 Problems in relationship with spouse or partner: Secondary | ICD-10-CM | POA: Diagnosis not present

## 2018-03-31 DIAGNOSIS — F324 Major depressive disorder, single episode, in partial remission: Secondary | ICD-10-CM

## 2018-03-31 DIAGNOSIS — F902 Attention-deficit hyperactivity disorder, combined type: Secondary | ICD-10-CM | POA: Diagnosis not present
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
## 2018-04-07 ENCOUNTER — Ambulatory Visit: Payer: 59 | Admitting: Psychiatry

## 2018-04-08 ENCOUNTER — Encounter: Payer: Self-pay | Admitting: Psychiatry

## 2018-04-08 ENCOUNTER — Ambulatory Visit: Payer: 59 | Admitting: Psychiatry

## 2018-04-08 NOTE — Progress Notes (Signed)
No-show Documentation -- Marliss Czar, PhD, [facility]  Patient ID: Brandon Kemp     MRN: [id number]     Date: 04/08/2018       Failed to show for appointment at 10am.  Charge normally, may waive on credible explanation of mitigating circumstances.    Robley Fries, PhD

## 2018-04-14 ENCOUNTER — Encounter: Payer: Self-pay | Admitting: Psychiatry

## 2018-04-14 ENCOUNTER — Ambulatory Visit: Payer: 59 | Admitting: Psychiatry

## 2018-04-14 NOTE — Progress Notes (Deleted)
Admin note for No-show or Short Notice Cancellation  Patient ID: Brandon Kemp  MRN: [id number] DATE: 04/14/2018  No show for 2pm session.  2nd week in a row.  Phone message received expressing regrets for missing 9am today.  Staff requested to inquire with PT to tell him it was 9am last week, 2pm today, check on his wishes, offer no-penalty reschedule for any other opening this week.  Nunzio Banet, PhD   

## 2018-04-14 NOTE — Progress Notes (Signed)
Admin note for No-show or Short Notice Cancellation  Patient ID: Brandon Kemp  MRN: [id number] DATE: 04/14/2018  No show for 2pm session.  2nd week in a row.  Phone message received expressing regrets for missing 9am today.  Staff requested to inquire with PT to tell him it was 9am last week, 2pm today, check on his wishes, offer no-penalty reschedule for any other opening this week.  Robley Fries, PhD

## 2018-04-21 ENCOUNTER — Encounter: Payer: Self-pay | Admitting: Psychiatry

## 2018-04-21 ENCOUNTER — Ambulatory Visit: Payer: 59 | Admitting: Psychiatry

## 2018-04-21 NOTE — Progress Notes (Deleted)
No-show Documentation -- Andy Twila Rappa, PhD, [facility]  Patient ID: Brandon Kemp     MRN: [id number]     Date: 04/21/2018      Failed to show for appointment at 8am, after failing last two appointments.  Charge normally, may RS.  Request staff follow up to inquire about his wishes and organization.    Christ Fullenwider, PhD   

## 2018-04-21 NOTE — Progress Notes (Signed)
No-show Documentation -- Marliss Czar, PhD, [facility]  Patient ID: Brandon Kemp     MRN: [id number]     Date: 04/21/2018      Failed to show for appointment at 8am, after failing last two appointments.  Charge normally, may RS.  Request staff follow up to inquire about his wishes and organization.    Robley Fries, PhD

## 2018-05-06 ENCOUNTER — Ambulatory Visit: Payer: 59 | Admitting: Psychiatry

## 2018-05-06 DIAGNOSIS — F902 Attention-deficit hyperactivity disorder, combined type: Secondary | ICD-10-CM

## 2018-05-06 DIAGNOSIS — Z630 Problems in relationship with spouse or partner: Secondary | ICD-10-CM | POA: Diagnosis not present

## 2018-05-06 DIAGNOSIS — F4322 Adjustment disorder with anxiety: Secondary | ICD-10-CM

## 2018-05-06 DIAGNOSIS — F324 Major depressive disorder, single episode, in partial remission: Secondary | ICD-10-CM

## 2018-05-06 NOTE — Progress Notes (Signed)
Psychotherapy Progress Note [facility] Marliss Czar, PhD LP  Patient ID: Brandon Kemp     MRN: [id number]     Therapy format: Individual psychotherapy Date: 05/06/2018     Start: 8:14a Stop: 9:05a Time Spent: 51 min  Session narrative -- presenting needs, interim history, self-report of stressors and symptoms, applications of prior therapy, status changes, and interventions made in session Run of missed appts with [place].  (Hx of being discharged by PCP for same.)  Feb 5 was away with wife at her dr appt in [facility], Feb 11 was rescheduling for [employer] but had appt mislabelled.  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  Resolved to set 1-day reminders for things on his phone calendar.    Anxious at home, now back with wife, knows trust is still tentative.  Stays nervous about performance expectations at work, after being in an easier territory to make quota.  Can have a lot of anxiety about how it's going to work out in both arenas.  Wife can still be nervous, quick to react, suspicious of his parents, and still wanting him to sign a pact to forfeit resources if he leaves again.  Discussed as unrealistic, which he has already communicated.  Offered it would be of value to dig into wife's needs and questions further, probe what she needs out of requests like that and see if something less litigious could be in order, and perhaps she could see the contradiction between the living together they are doing and the verbal frames she wants to put things in under stress.  In order to accomplish some claming influence, recommended compassionate listening in the face of "you gotta" statements -- e.g., "I know it makes you nervous for ___ to happen.  What do you need out of it?"  For wife's feared scenarios of how he will explain  their relationship and status to others, strongly recommended framing minimal answers to others, e.g., "We're working on it", and if need be, "It's a Personnel officer.  I'd rather not talk that much about it while we're working on it."  Otherwise, with wife adopt a posture of "OK, I can try to help you out.  What do you actually need?" and when challenging anything, try not to leap to the defensive but ask, ask, ask.  Trying to refinance the house to consolidate debt and finance much-needed work on suspected allergenic problem areas.    Re. psychiatry, remains on Ritalin, now BID instead of 2 qAM.  Also 3 x 75mg  Effexor.  Backing off on alprazolam to spare sedation, costs, and possible dependency.  Advised same.   Has been mostly out of touch with his parents, including for his [birth year].  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  Meanwhile, will need to return to the subject of how and when to normalize relations, as wife's open-ended moratorium from during their separation cannot afford to be legitimized as a rule, only a temporary agreement.  Therapeutic modalities: Cognitive Behavioral Therapy and Assertiveness/Communication  Mental Status/Observations:  Appearance:   Casual     Behavior:  Appropriate  Motor:  Normal  Speech/Language:   Clear and  Coherent, Normal Rate and mildly pressured, with occasional stammer  Affect:  Appropriate  Mood:  anxious  Thought process:  tangential  Thought content:    WNL and multiple worries  Sensory/Perceptual disturbances:    WNL  Orientation:  WNl  Attention:  Fair  Concentration:  Fair  Memory:  WNL  Insight:    Fair  Judgment:   Fair  Impulse Control:  Fair   Risk Assessment: Danger to Self:  No Self-injurious Behavior: No Danger to Others: No Duty to Warn:no Physical  Aggression / Violence:No  Access to Firearms a concern: No   Diagnosis:   ICD-10-CM   1. Major depressive disorder with single episode, in partial remission (HCC) F32.4   2. Adjustment disorder with anxious mood F43.22   3. Relationship problem between partners Z63.0   4. Attention deficit hyperactivity disorder (ADHD), combined type F90.2     Assessment of progress:  stable  Plan:  . Watch boundaries and guard against unrealistic agreements . Reframe wife's more provocative statements as above and adopt a customer-service approach to better define needs . Monitor for medication issues including DFA . Continue to utilize previously learned skills ad lib . Maintain medication as prescribed and work faithfully with relevant prescriber(s) if any changes are desired or seem indicated . Call the clinic on-call service, present to ER, or call 911 if any life-threatening emergency Return in about 2 weeks (around 05/20/2018) for should schedule ahead, Invite significant other.   Brandon Fries, PhD [place] Licensed Psychologist

## 2018-05-20 ENCOUNTER — Ambulatory Visit: Payer: 59 | Admitting: Psychiatry

## 2018-05-20 ENCOUNTER — Encounter: Payer: Self-pay | Admitting: Psychiatry

## 2018-05-20 NOTE — Progress Notes (Signed)
No-show/Short-notice cancellation note Marliss Czar, PhD, [facility]  Patient ID: MALLORY JUSTEN II     MRN: [id number]     Date: 05/20/2018     Appt time: 8am  NS for appointment, following alert to patient at last session for multiple misses.  Conceivably related to COVID-19 concerns, personal anxiety, or ongoing ADHD/organization problems, but no word received.  Charge per policy, RS as able.  Robley Fries, PhD

## 2018-05-26 NOTE — Progress Notes (Signed)
Psychotherapy Progress Note [facility] Marliss Czar, PhD LP  Patient ID: Brandon Kemp     MRN: [id number]     Therapy format: Individual psychotherapy Date: 05/27/2018     Start: 8:09a Stop: 8:59a Time Spent: 50 min  I connected with patient by telephone, failing videoconference, with his informed consent, and verified patient privacy and that I am speaking with the correct person using two identifiers.  I was located at home and patient at home.  Session narrative -- presenting needs, interim history, self-report of stressors and symptoms, applications of prior therapy, status changes, and interventions made in session Technical problems getting video to work on phone.  Will download Webex for phone and computer outside of the call.    Difficult being home.  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  Company being very considerate, clear no layoffs.  Challenging being so at home with each other.   Challenging with two active boys, including a 43yo with ADD like himself.  Challenging for everyone to remain patient with each other.  Proposed "House Rules" (1) Nothing will go perfectly, but we will all manage.  (2) I'm going to bring my patience even if you miss yours.  (3) Whatever seems like too much will still be temporary.  (4) We will try to ask for each other's attention before just starting to talk at each other.  Re. work, worrying about maintaining contact with pharma customers while interrupted from travel, has tried to make some keeping-in-touch calls but is getting brushback (as they try to deal with pandemic issues).  Discussed the value in setting up appointments - tentatively - for after social distancing rules relax, but recommended to give his professional customers the gift of not distracting them in crisis, work through Set designer for [employer] concerns, and show himself to be understanding of difficulties making time commitments.  Finds being home so much to  be somewhat haunting, resembles the period of time he was out of work, worrying hard over money, feeling more and more victim of wife's negativity and excesses and was becoming frankly demoralized and depressed.  Denies feeling quite demoralized at this time, but does succumb to many worry thoughts and serial distractions.  Figures getting out of pajamas, dressed for the day, would help, but not yet doing so on a reliable basis.  Challenged to set tone for the days by getting dressed as early as possible, making a point of putting on shoes, because each day is in fact a work day of some kind, and appearances will drive motivations and a  decision-making mindset rather than a victim mindset.  Financial stress of refinancing and preparing to redo flooring (remove carpet that is long past date and probably has mold in it).  Discussed reno costs as an investment in lowering allergens and tensions and healthcare bills ultimately.  Issues with cash flow and trust, but notes he can spend spontaneously just like wife and have a double standard about which to worry about.  Does plan to pay down debts with refi cash for peace of mind.  Discussed work from home challenges, mainly about environment, time allocation, and interruptions.  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  Has a window, but a lot of visual distraction with things that need doing.  Encouraged to go ahead and clear the space best he can, visually,  resorting if needed to masking it with a bedsheet.  Recommended to declutter when able, make sure he can get a view outside every now and then to cut down sense of confinement, and set 2 hrs a day of uninterruptible time, using voice and a sign if needed.  Explain to anyone who needs that it is important to be able to concentrate, so treat him as if he is not in the house for those 2 hours.  Would like to continue Wed morning 8am  appts.  Redirected to office to schedule in 2 wks.  Therapeutic modalities: Cognitive Behavioral Therapy, Assertiveness/Communication, Ego-Supportive and practical advice  Mental Status/Observations:  Appearance:   Not assessed     Behavior:  Not assessed  Motor:  Not assessed  Speech/Language:   Clear and Coherent  Affect:  Not assessed  Mood:  anxious and dysthymic  Thought process:  tangential  Thought content:    WNL  Sensory/Perceptual disturbances:    WNL  Orientation:  grossly intact  Attention:  Fair  Concentration:  Fair  Memory:  grossly intact  Insight:    Fair  Judgment:   Good  Impulse Control:  Good   Risk Assessment: Danger to Self:  No Self-injurious Behavior: No Danger to Others: No Duty to Warn:no Physical Aggression / Violence:No  Access to Firearms a concern: No   Diagnosis:   ICD-10-CM   1. Major depressive disorder with single episode, in partial remission (HCC) F32.4   2. Adjustment disorder with anxious mood F43.22   3. Relationship problem between partners Z63.0   4. Attention deficit hyperactivity disorder (ADHD), combined type F90.2     Assessment of progress:  stable  Plan:  . Recommendations/advice as noted above . Particularly employ family agreements, protected work time, dress for work for stress reduction and fight the temptation to judge the situation based on resemblances and the old feeling of being trapped/stifled . Continue to utilize previously learned skills ad lib . Maintain medication as prescribed and work faithfully with relevant prescriber(s) if any changes are desired or seem indicated . Call the clinic on-call service, present to ER, or call 911 if any life-threatening emergency Return in about 2 weeks (around 06/10/2018) for set up as teletherapy session.   Robley Fries, PhD [place] Licensed Psychologist

## 2018-05-27 ENCOUNTER — Ambulatory Visit (INDEPENDENT_AMBULATORY_CARE_PROVIDER_SITE_OTHER): Payer: 59 | Admitting: Psychiatry

## 2018-05-27 ENCOUNTER — Other Ambulatory Visit: Payer: Self-pay

## 2018-05-27 DIAGNOSIS — F902 Attention-deficit hyperactivity disorder, combined type: Secondary | ICD-10-CM | POA: Diagnosis not present

## 2018-05-27 DIAGNOSIS — Z630 Problems in relationship with spouse or partner: Secondary | ICD-10-CM | POA: Diagnosis not present

## 2018-05-27 DIAGNOSIS — F324 Major depressive disorder, single episode, in partial remission: Secondary | ICD-10-CM

## 2018-05-27 DIAGNOSIS — F4322 Adjustment disorder with anxiety: Secondary | ICD-10-CM

## 2019-02-15 ENCOUNTER — Ambulatory Visit (INDEPENDENT_AMBULATORY_CARE_PROVIDER_SITE_OTHER): Payer: 59 | Admitting: Cardiovascular Disease

## 2019-02-15 ENCOUNTER — Other Ambulatory Visit: Payer: Self-pay

## 2019-02-15 ENCOUNTER — Encounter: Payer: Self-pay | Admitting: Cardiovascular Disease

## 2019-02-15 VITALS — BP 132/100 | HR 73 | Ht 70.5 in | Wt 228.0 lb

## 2019-02-15 DIAGNOSIS — I10 Essential (primary) hypertension: Secondary | ICD-10-CM

## 2019-02-15 DIAGNOSIS — I371 Nonrheumatic pulmonary valve insufficiency: Secondary | ICD-10-CM | POA: Diagnosis not present

## 2019-02-15 MED ORDER — AMLODIPINE BESYLATE 5 MG PO TABS: 5.0000 mg | ORAL_TABLET | Freq: Every day | ORAL | 3 refills | Status: DC

## 2019-02-15 NOTE — Patient Instructions (Signed)
Medication Instructions:  1) START AMLODIPINE 5 mg daily *If you need a refill on your cardiac medications before your next appointment, please call your pharmacy*  Lab Work: Your provider recommends that you return for FASTING lab work. If you have labs (blood work) drawn today and your tests are completely normal, you will receive your results only by: Marland Kitchen MyChart Message (if you have MyChart) OR . A paper copy in the mail If you have any lab test that is abnormal or we need to change your treatment, we will call you to review the results.  Follow-Up: At [facility], you and your health needs are our priority.  As part of our continuing mission to provide you with exceptional heart care, we have created designated Provider Care Teams.  These Care Teams include your primary Cardiologist (physician) and Advanced Practice Providers (APPs -  Physician Assistants and Nurse Practitioners) who all work together to provide you with the care you need, when you need it. Your next appointment:   12 month(s) The format for your next appointment:   In Person Provider:   You may see Sherren Mocha, MD or one of the following Advanced Practice Providers on your designated Care Team:    Richardson Dopp, PA-C  [place], [place]  Daune Perch, NP  Other Instructions Heart monitor = [employer] (you can find this online and on [place])

## 2019-02-15 NOTE — Progress Notes (Signed)
Cardiology Office Note:    Date:  02/15/2019   ID:  [name], DOB [birth year], MRN [id number]  PCP:  Patient, No Pcp Per  Cardiologist:  Sherren Mocha, MD  Electrophysiologist:  None   Referring MD: No ref. provider found   Chief Complaint  Patient presents with  . Hypertension    History of Present Illness:    Brandon Kemp is a 43 y.o. male with a hx of hypertension and congenital pulmonic stenosis, presenting for follow-up evaluation.  The patient has a history of congenital pulmonic stenosis and underwent surgical repair at age 89. An echocardiogram in 2013 showed normal left ventricular function, mild LVH, and moderate pulmonic regurgitation without significant pulmonic stenosis. He's undergone exercise stress testing that showed good exercise tolerance without significant ST or T-wave changes. Because of frequent palpitations, he underwent a Holter monitor showing rare PVCs. There is no sustained arrhythmias or significant pauses. A renal arterial duplex in 2013 was normal with no evidence of renal artery stenosis. 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   The patient is here alone today.  He has gained a significant amount of weight since his last visit here.  He attributes this to not exercising regularly and not following up prudent diet.  He has occasional discomfort in his left arm and mid back with exercise.  Notes that his blood pressure has not been well controlled and he is no longer taking any antihypertensive medications.  He has been on metoprolol succinate and losartan in the past.  No resting chest pain or pressure.  No orthopnea or PND.  He has rare episodes where he suddenly feels heart palpitations and shortness of breath.  These are transient.  Past Medical History:  Diagnosis Date  . Chest pain    a. 09/2011 ETT @ [facility]: Walked 13 mins, rare PVC's, couplet (symptomatic), no acute st/t changes.  . Congenital pulmonic valve  stenosis    a. s/p surgical repair @ Age 89 in [place];  b.  12/12  Echo: EF 55-60%, nl wall motion, mild LVH, moderate PR.  Marland Kitchen History of tobacco abuse   . Hypertension   . Relationship problem between partners 12/07/2017  . Symptomatic PVCs    a. 09/2011    Past Surgical History:  Procedure Laterality Date  . ANKLE SURGERY    . HAND SURGERY    . pulmonary outflow patch  age 54  . pulmonary valvotomy  age 32    Current Medications: Current Meds  Medication Sig  . ALPRAZolam (XANAX) 0.5 MG tablet TAKE 1 TABLET 3 TIMES A DAY AS NEEDED FOR ANXIETY. MAY TAKE 1/2 TABLET IF EFFECTIVE  . methylphenidate (RITALIN LA) 10 MG 24 hr capsule Take 10 mg by mouth daily.   Marland Kitchen venlafaxine XR (EFFEXOR-XR) 75 MG 24 hr capsule TAKE ONE CAPSULE BY MOUTH EVERY MORNING WITH FOOD     Allergies:   Patient has no known allergies.   Social History   Socioeconomic History  . Marital status: Married    Spouse name: Not on file  . Number of children: Not on file  . Years of education: Not on file  . Highest education level: Bachelor's degree (e.g., BA, AB, BS)  Occupational History  . Not on file  Tobacco Use  . Smoking status: Former Research scientist (life sciences)  . Smokeless tobacco: Never Used  . Tobacco comment: sometimes, when stressed about 2 cigarettes a day  Substance and Sexual Activity  . Alcohol  use: No    Comment: social  . Drug use: No  . Sexual activity: Not Currently    Birth control/protection: Abstinence  Other Topics Concern  . Not on file  Social History Narrative  . Not on file   Social Determinants of Health   Financial Resource Strain:   . Difficulty of Paying Living Expenses: Not on file  Food Insecurity:   . Worried About Charity fundraiser in the Last Year: Not on file  . Ran Out of Food in the Last Year: Not on file  Transportation Needs:   . Lack of Transportation (Medical): Not on file  . Lack of Transportation (Non-Medical): Not on file  Physical Activity:   . Days of Exercise per  Week: Not on file  . Minutes of Exercise per Session: Not on file  Stress:   . Feeling of Stress : Not on file  Social Connections:   . Frequency of Communication with Friends and Family: Not on file  . Frequency of Social Gatherings with Friends and Family: Not on file  . Attends Religious Services: Not on file  . Active Member of Clubs or Organizations: Not on file  . Attends Archivist Meetings: Not on file  . Marital Status: Not on file     Family History: The patient's family history includes Hypertension in his father; Yves Dill Parkinson White syndrome in his brother.  ROS:   Please see the history of present illness.    All other systems reviewed and are negative.  EKGs/Labs/Other Studies Reviewed:    EKG:  EKG is ordered today.  The ekg ordered today demonstrates normal sinus rhythm 73 bpm, rightward axis  Recent Labs: No results found for requested labs within last 8760 hours.  Recent Lipid Panel    Component Value Date/Time   CHOL 155 07/23/2016 0839   TRIG 46 07/23/2016 0839   HDL 60 07/23/2016 0839   CHOLHDL 2.6 07/23/2016 0839   CHOLHDL 5 09/09/2014 0902   VLDL 23.6 09/09/2014 0902   LDLCALC 86 07/23/2016 0839   LDLDIRECT 155.7 09/26/2011 1031    Physical Exam:    VS:  BP (!) 132/100   Pulse 73   Ht 5' 10.5" (1.791 m)   Wt 228 lb (103.4 kg)   SpO2 98%   BMI 32.25 kg/m     Wt Readings from Last 3 Encounters:  02/15/19 228 lb (103.4 kg)  10/27/17 191 lb 12.8 oz (87 kg)  03/05/17 178 lb 9.6 oz (81 kg)     GEN:  Well nourished, well developed in no acute distress HEENT: Normal NECK: No JVD; No carotid bruits LYMPHATICS: No lymphadenopathy CARDIAC: RRR, no murmurs, rubs, gallops RESPIRATORY:  Clear to auscultation without rales, wheezing or rhonchi  ABDOMEN: Soft, non-tender, non-distended MUSCULOSKELETAL:  No edema; No deformity  SKIN: Warm and dry NEUROLOGIC:  Alert and oriented x 3 PSYCHIATRIC:  Normal affect   ASSESSMENT:    1.  Nonrheumatic pulmonary valve insufficiency   2. Essential hypertension    PLAN:    In order of problems listed above:  1. Previous echo studies reviewed.  No murmur on exam.  Patient clinically stable we will follow-up in 1 year. 2. Blood pressure is uncontrolled.  Reviewed lifestyle modification at length with diet and exercise measures.  He is motivated to lose weight and increase his exercise regimen.  Add amlodipine 5 mg daily.  Reviewed medical treatment at length and potential side effects of amlodipine.  I am  hopeful that he will tolerate this better than his other antihypertensive medicines in the past.  We will check labs today with a CBC, metabolic panel, lipid panel, and TSH.   Medication Adjustments/Labs and Tests Ordered: Current medicines are reviewed at length with the patient today.  Concerns regarding medicines are outlined above.  Orders Placed This Encounter  Procedures  . CBC w/Diff  . Comp Met (CMET)  . Lipid panel  . TSH  . EKG 12-Lead   Meds ordered this encounter  Medications  . amLODipine (NORVASC) 5 MG tablet    Sig: Take 1 tablet (5 mg total) by mouth daily.    Dispense:  90 tablet    Refill:  3    Patient Instructions  Medication Instructions:  1) START AMLODIPINE 5 mg daily *If you need a refill on your cardiac medications before your next appointment, please call your pharmacy*  Lab Work: Your provider recommends that you return for FASTING lab work. If you have labs (blood work) drawn today and your tests are completely normal, you will receive your results only by: Marland Kitchen MyChart Message (if you have MyChart) OR . A paper copy in the mail If you have any lab test that is abnormal or we need to change your treatment, we will call you to review the results.  Follow-Up: At [facility], you and your health needs are our priority.  As part of our continuing mission to provide you with exceptional heart care, we have created designated Provider Care  Teams.  These Care Teams include your primary Cardiologist (physician) and Advanced Practice Providers (APPs -  Physician Assistants and Nurse Practitioners) who all work together to provide you with the care you need, when you need it. Your next appointment:   12 month(s) The format for your next appointment:   In Person Provider:   You may see Sherren Mocha, MD or one of the following Advanced Practice Providers on your designated Care Team:    Richardson Dopp, PA-C  [place], [place]  Daune Perch, NP  Other Instructions Heart monitor = [employer] (you can find this online and on [place])    Signed, Sherren Mocha, MD  02/15/2019 9:27 AM    [place]

## 2019-02-16 ENCOUNTER — Other Ambulatory Visit: Payer: 59

## 2019-02-17 ENCOUNTER — Other Ambulatory Visit: Payer: 59 | Admitting: *Deleted

## 2019-02-17 ENCOUNTER — Other Ambulatory Visit: Payer: Self-pay

## 2019-02-17 DIAGNOSIS — I371 Nonrheumatic pulmonary valve insufficiency: Secondary | ICD-10-CM

## 2019-02-17 DIAGNOSIS — I10 Essential (primary) hypertension: Secondary | ICD-10-CM

## 2019-02-17 LAB — COMPREHENSIVE METABOLIC PANEL
ALT: 26 IU/L (ref 0–44)
AST: 25 IU/L (ref 0–40)
Albumin/Globulin Ratio: 2.1 (ref 1.2–2.2)
Albumin: 4.4 g/dL (ref 4.0–5.0)
Alkaline Phosphatase: 69 IU/L (ref 39–117)
BUN/Creatinine Ratio: 13 (ref 9–20)
BUN: 15 mg/dL (ref 6–24)
Bilirubin Total: 0.6 mg/dL (ref 0.0–1.2)
CO2: 25 mmol/L (ref 20–29)
Calcium: 9.6 mg/dL (ref 8.7–10.2)
Chloride: 100 mmol/L (ref 96–106)
Creatinine, Ser: 1.16 mg/dL (ref 0.76–1.27)
GFR calc Af Amer: 89 mL/min/{1.73_m2} (ref >59)
GFR calc non Af Amer: 77 mL/min/{1.73_m2} (ref >59)
Globulin, Total: 2.1 g/dL (ref 1.5–4.5)
Glucose: 108 mg/dL — ABNORMAL HIGH (ref 65–99)
Potassium: 5 mmol/L (ref 3.5–5.2)
Sodium: 137 mmol/L (ref 134–144)
Total Protein: 6.5 g/dL (ref 6.0–8.5)

## 2019-02-17 LAB — CBC WITH DIFFERENTIAL/PLATELET
Basophils Absolute: 0.1 10*3/uL (ref 0.0–0.2)
Basos: 1 %
EOS (ABSOLUTE): 0.1 10*3/uL (ref 0.0–0.4)
Eos: 2 %
Hematocrit: 47.5 % (ref 37.5–51.0)
Hemoglobin: 16.4 g/dL (ref 13.0–17.7)
Immature Grans (Abs): 0 10*3/uL (ref 0.0–0.1)
Immature Granulocytes: 0 %
Lymphocytes Absolute: 1.9 10*3/uL (ref 0.7–3.1)
Lymphs: 37 %
MCH: 31.3 pg (ref 26.6–33.0)
MCHC: 34.5 g/dL (ref 31.5–35.7)
MCV: 91 fL (ref 79–97)
Monocytes Absolute: 0.4 10*3/uL (ref 0.1–0.9)
Monocytes: 9 %
Neutrophils Absolute: 2.6 10*3/uL (ref 1.4–7.0)
Neutrophils: 51 %
Platelets: 261 10*3/uL (ref 150–450)
RBC: 5.24 x10E6/uL (ref 4.14–5.80)
RDW: 12 % (ref 11.6–15.4)
WBC: 5.1 10*3/uL (ref 3.4–10.8)

## 2019-02-17 LAB — LIPID PANEL
Chol/HDL Ratio: 5.9 ratio — ABNORMAL HIGH (ref 0.0–5.0)
Cholesterol, Total: 265 mg/dL — ABNORMAL HIGH (ref 100–199)
HDL: 45 mg/dL (ref >39)
LDL Chol Calc (NIH): 189 mg/dL — ABNORMAL HIGH (ref 0–99)
Triglycerides: 166 mg/dL — ABNORMAL HIGH (ref 0–149)
VLDL Cholesterol Cal: 31 mg/dL (ref 5–40)

## 2019-02-17 LAB — TSH: TSH: 2.67 u[IU]/mL (ref 0.450–4.500)

## 2019-02-19 ENCOUNTER — Telehealth: Payer: Self-pay

## 2019-02-19 DIAGNOSIS — E785 Hyperlipidemia, unspecified: Secondary | ICD-10-CM

## 2019-02-19 DIAGNOSIS — I10 Essential (primary) hypertension: Secondary | ICD-10-CM

## 2019-02-19 MED ORDER — ATORVASTATIN CALCIUM 10 MG PO TABS: 10.0000 mg | ORAL_TABLET | Freq: Every day | ORAL | 3 refills | Status: DC

## 2019-02-19 MED ORDER — COENZYME Q10 100 MG PO TABS: 200.0000 mg | ORAL_TABLET | Freq: Every day | ORAL | 0 refills | Status: DC

## 2019-02-19 NOTE — Telephone Encounter (Signed)
Reviewed results with patient who verbalized understanding.   Instructed patient to take CoQ 10 200 mg daily for 2 weeks then START LIPITOR 10 mg daily. Repeat fasting labs scheduled 06/03/2019. He was grateful for call and agrees with treatment plan.

## 2019-06-03 ENCOUNTER — Other Ambulatory Visit: Payer: 59

## 2019-06-16 ENCOUNTER — Emergency Department (HOSPITAL_BASED_OUTPATIENT_CLINIC_OR_DEPARTMENT_OTHER)
Admission: EM | Admit: 2019-06-16 | Discharge: 2019-06-16 | Disposition: A | Discharge status: Home / Self Care | Payer: 59 | Attending: Emergency Medicine | Admitting: Emergency Medicine

## 2019-06-16 ENCOUNTER — Other Ambulatory Visit: Payer: Self-pay

## 2019-06-16 ENCOUNTER — Emergency Department (HOSPITAL_BASED_OUTPATIENT_CLINIC_OR_DEPARTMENT_OTHER): Payer: 59

## 2019-06-16 ENCOUNTER — Encounter (HOSPITAL_BASED_OUTPATIENT_CLINIC_OR_DEPARTMENT_OTHER): Payer: Self-pay | Admitting: Emergency Medicine

## 2019-06-16 DIAGNOSIS — R1032 Left lower quadrant pain: Secondary | ICD-10-CM | POA: Diagnosis present

## 2019-06-16 DIAGNOSIS — I10 Essential (primary) hypertension: Secondary | ICD-10-CM | POA: Insufficient documentation

## 2019-06-16 DIAGNOSIS — R109 Unspecified abdominal pain: Secondary | ICD-10-CM

## 2019-06-16 DIAGNOSIS — Z79899 Other long term (current) drug therapy: Secondary | ICD-10-CM | POA: Diagnosis not present

## 2019-06-16 LAB — BASIC METABOLIC PANEL
Anion gap: 9 (ref 5–15)
BUN: 18 mg/dL (ref 6–20)
CO2: 24 mmol/L (ref 22–32)
Calcium: 9 mg/dL (ref 8.9–10.3)
Chloride: 101 mmol/L (ref 98–111)
Creatinine, Ser: 1.19 mg/dL (ref 0.61–1.24)
GFR calc Af Amer: >60 mL/min (ref >60)
GFR calc non Af Amer: >60 mL/min (ref >60)
Glucose, Bld: 152 mg/dL — ABNORMAL HIGH (ref 70–99)
Potassium: 4.3 mmol/L (ref 3.5–5.1)
Sodium: 134 mmol/L — ABNORMAL LOW (ref 135–145)

## 2019-06-16 LAB — CBC WITH DIFFERENTIAL/PLATELET
Abs Immature Granulocytes: 0.03 10*3/uL (ref 0.00–0.07)
Basophils Absolute: 0 10*3/uL (ref 0.0–0.1)
Basophils Relative: 0 %
Eosinophils Absolute: 0.2 10*3/uL (ref 0.0–0.5)
Eosinophils Relative: 2 %
HCT: 45.9 % (ref 39.0–52.0)
Hemoglobin: 15.4 g/dL (ref 13.0–17.0)
Immature Granulocytes: 0 %
Lymphocytes Relative: 15 %
Lymphs Abs: 1.7 10*3/uL (ref 0.7–4.0)
MCH: 31 pg (ref 26.0–34.0)
MCHC: 33.6 g/dL (ref 30.0–36.0)
MCV: 92.5 fL (ref 80.0–100.0)
Monocytes Absolute: 0.6 10*3/uL (ref 0.1–1.0)
Monocytes Relative: 6 %
Neutro Abs: 8.5 10*3/uL — ABNORMAL HIGH (ref 1.7–7.7)
Neutrophils Relative %: 77 %
Platelets: 256 10*3/uL (ref 150–400)
RBC: 4.96 MIL/uL (ref 4.22–5.81)
RDW: 12.1 % (ref 11.5–15.5)
WBC: 11.1 10*3/uL — ABNORMAL HIGH (ref 4.0–10.5)
nRBC: 0 % (ref 0.0–0.2)

## 2019-06-16 LAB — URINALYSIS, ROUTINE W REFLEX MICROSCOPIC
Bilirubin Urine: NEGATIVE
Glucose, UA: NEGATIVE mg/dL
Hgb urine dipstick: NEGATIVE
Ketones, ur: NEGATIVE mg/dL
Leukocytes,Ua: NEGATIVE
Nitrite: NEGATIVE
Protein, ur: NEGATIVE mg/dL
Specific Gravity, Urine: 1.015 (ref 1.005–1.030)
pH: 7.5 (ref 5.0–8.0)

## 2019-06-16 MED ORDER — DICLOFENAC SODIUM ER 100 MG PO TB24: 100.0000 mg | ORAL_TABLET | Freq: Every day | ORAL | 0 refills | Status: DC

## 2019-06-16 MED ORDER — ONDANSETRON 8 MG PO TBDP: ORAL_TABLET | ORAL | 0 refills | Status: DC

## 2019-06-16 MED ORDER — KETOROLAC TROMETHAMINE 30 MG/ML IJ SOLN
30.0000 mg | Freq: Once | INTRAMUSCULAR | Status: AC
  Administered 2019-06-16: 30 mg via INTRAVENOUS
  Filled 2019-06-16: qty 1

## 2019-06-16 NOTE — ED Triage Notes (Signed)
Pt c/o left flank pain

## 2019-06-16 NOTE — ED Provider Notes (Signed)
[facility] Provider Note   CSN: [id number] Arrival date & time: 06/16/19  0347     History Chief Complaint  Patient presents with   Flank Pain    Brandon Kemp is a 44 y.o. male.  The history is provided by the patient.  Flank Pain This is a new problem. The current episode started 3 to 5 hours ago (left flank non radiating ). The problem occurs constantly. The problem has not changed since onset.Pertinent negatives include no chest pain, no abdominal pain, no headaches and no shortness of breath. Nothing aggravates the symptoms. Nothing relieves the symptoms. Treatments tried: tylenol and oxycodone  The treatment provided no relief.  No urinary symptoms.  No n/v/d.  No diarrhea.       Past Medical History:  Diagnosis Date   Chest pain    a. 09/2011 ETT @ [facility]: Walked 13 mins, rare PVC's, couplet (symptomatic), no acute st/t changes.   Congenital pulmonic valve stenosis    a. s/p surgical repair @ Age 89 in [place];  b.  12/12  Echo: EF 55-60%, nl wall motion, mild LVH, moderate PR.   History of tobacco abuse    Hypertension    Relationship problem between partners 12/07/2017   Symptomatic PVCs    a. 09/2011    Patient Active Problem List   Diagnosis Date Noted   Attention deficit hyperactivity disorder (ADHD) 12/07/2017   Adjustment disorder with mixed anxiety and depressed mood 12/07/2017   Relationship problem between partners 12/07/2017   Radial styloid tenosynovitis of right hand 07/18/2017   S/P bilateral inguinal hernia repair, follow-up exam 02/10/2014   Bilateral inguinal hernia without obstruction or gangrene 12/27/2013   Hypertension goal BP (blood pressure) < 130/80 12/27/2013   Carpal tunnel syndrome 08/05/2013   Migraine with aura 08/05/2013   Other intervertebral disc degeneration, lumbar region 08/05/2013   Physiological tremor 08/05/2013   Ventricular premature beats 09/26/2011   Atypical chest  pain 02/05/2011   Congenital pulmonary valve stenosis 02/05/2011    Past Surgical History:  Procedure Laterality Date   ANKLE SURGERY     HAND SURGERY     pulmonary outflow patch  age 56   pulmonary valvotomy  age 30       Family History  Problem Relation Age of Onset   Yves Dill Parkinson White syndrome Brother    Hypertension Father     Social History   Tobacco Use   Smoking status: Former Smoker   Smokeless tobacco: Never Used   Tobacco comment: sometimes, when stressed about 2 cigarettes a day  Substance Use Topics   Alcohol use: No    Comment: social   Drug use: No    Home Medications Prior to Admission medications   Medication Sig Start Date End Date Taking? Authorizing Provider  ALPRAZolam (XANAX) 0.5 MG tablet TAKE 1 TABLET 3 TIMES A DAY AS NEEDED FOR ANXIETY. MAY TAKE 1/2 TABLET IF EFFECTIVE 09/24/17   [provider]  amLODipine (NORVASC) 5 MG tablet Take 1 tablet (5 mg total) by mouth daily. 02/15/19 02/10/20  Sherren Mocha, MD  atorvastatin (LIPITOR) 10 MG tablet Take 1 tablet (10 mg total) by mouth daily. 02/19/19 02/14/20  Sherren Mocha, MD  Coenzyme Q10 100 MG TABS Take 2 tablets (200 mg total) by mouth daily. 02/19/19   Sherren Mocha, MD  Diclofenac Sodium CR 100 MG 24 hr tablet Take 1 tablet (100 mg total) by mouth daily. 06/16/19   Baeleigh Devincent, MD  losartan (COZAAR) 100 MG tablet Take 1 tablet (100 mg total) by mouth daily. Patient not taking: Reported on 02/15/2019 10/27/17 10/22/18  [name], MD  methylphenidate (RITALIN LA) 10 MG 24 hr capsule Take 10 mg by mouth daily.  10/23/17   [provider]  ondansetron (ZOFRAN ODT) 8 MG disintegrating tablet 8mg  ODT q8 hours prn nausea 06/16/19   Herlinda Heady, MD  venlafaxine XR (EFFEXOR-XR) 75 MG 24 hr capsule TAKE ONE CAPSULE BY MOUTH EVERY MORNING WITH FOOD 10/16/17   [provider]    Allergies    Patient has no known allergies.  Review of Systems   Review  of Systems  Constitutional: Negative for fever.  HENT: Negative for congestion.   Eyes: Negative for visual disturbance.  Respiratory: Negative for shortness of breath.   Cardiovascular: Negative for chest pain.  Gastrointestinal: Negative for abdominal pain and nausea.  Genitourinary: Positive for flank pain.  Neurological: Negative for headaches.  Psychiatric/Behavioral: Negative for agitation.  All other systems reviewed and are negative.   Physical Exam Updated Vital Signs BP (!) 154/104 (BP Location: Right Arm)    Pulse 78    Temp 98.1 F (36.7 C) (Oral)    Resp 18    Ht 5\' 11"  (1.803 m)    Wt 102.1 kg    SpO2 100%    BMI 31.38 kg/m   Physical Exam Vitals and nursing note reviewed.  Constitutional:      Appearance: Normal appearance.  HENT:     Head: Normocephalic and atraumatic.     Nose: Nose normal.  Eyes:     Conjunctiva/sclera: Conjunctivae normal.     Pupils: Pupils are equal, round, and reactive to light.  Cardiovascular:     Rate and Rhythm: Normal rate and regular rhythm.     Pulses: Normal pulses.     Heart sounds: Normal heart sounds.  Pulmonary:     Effort: Pulmonary effort is normal.     Breath sounds: Normal breath sounds.  Abdominal:     General: Abdomen is flat. Bowel sounds are normal.     Tenderness: There is no abdominal tenderness. There is no guarding.  Musculoskeletal:        General: Normal range of motion.     Cervical back: Normal range of motion and neck supple.  Skin:    General: Skin is warm and dry.     Capillary Refill: Capillary refill takes less than 2 seconds.  Neurological:     General: No focal deficit present.     Mental Status: He is alert and oriented to person, place, and time.     Deep Tendon Reflexes: Reflexes normal.  Psychiatric:        Mood and Affect: Mood normal.        Behavior: Behavior normal.     ED Results / Procedures / Treatments   Labs (all labs ordered are listed, but only abnormal results are  displayed) Results for orders placed or performed during the hospital encounter of 06/16/19  Urinalysis, Routine w reflex microscopic  Result Value Ref Range   Color, Urine YELLOW YELLOW   APPearance CLEAR CLEAR   Specific Gravity, Urine 1.015 1.005 - 1.030   pH 7.5 5.0 - 8.0   Glucose, UA NEGATIVE NEGATIVE mg/dL   Hgb urine dipstick NEGATIVE NEGATIVE   Bilirubin Urine NEGATIVE NEGATIVE   Ketones, ur NEGATIVE NEGATIVE mg/dL   Protein, ur NEGATIVE NEGATIVE mg/dL   Nitrite NEGATIVE NEGATIVE   Leukocytes,Ua NEGATIVE  NEGATIVE  CBC with Differential/Platelet  Result Value Ref Range   WBC 11.1 (H) 4.0 - 10.5 K/uL   RBC 4.96 4.22 - 5.81 MIL/uL   Hemoglobin 15.4 13.0 - 17.0 g/dL   HCT 37.8 58.8 - 50.2 %   MCV 92.5 80.0 - 100.0 fL   MCH 31.0 26.0 - 34.0 pg   MCHC 33.6 30.0 - 36.0 g/dL   RDW 77.4 12.8 - 78.6 %   Platelets 256 150 - 400 K/uL   nRBC 0.0 0.0 - 0.2 %   Neutrophils Relative % 77 %   Neutro Abs 8.5 (H) 1.7 - 7.7 K/uL   Lymphocytes Relative 15 %   Lymphs Abs 1.7 0.7 - 4.0 K/uL   Monocytes Relative 6 %   Monocytes Absolute 0.6 0.1 - 1.0 K/uL   Eosinophils Relative 2 %   Eosinophils Absolute 0.2 0.0 - 0.5 K/uL   Basophils Relative 0 %   Basophils Absolute 0.0 0.0 - 0.1 K/uL   Immature Granulocytes 0 %   Abs Immature Granulocytes 0.03 0.00 - 0.07 K/uL  Basic metabolic panel  Result Value Ref Range   Sodium 134 (L) 135 - 145 mmol/L   Potassium 4.3 3.5 - 5.1 mmol/L   Chloride 101 98 - 111 mmol/L   CO2 24 22 - 32 mmol/L   Glucose, Bld 152 (H) 70 - 99 mg/dL   BUN 18 6 - 20 mg/dL   Creatinine, Ser 7.67 0.61 - 1.24 mg/dL   Calcium 9.0 8.9 - 20.9 mg/dL   GFR calc non Af Amer >60 >60 mL/min   GFR calc Af Amer >60 >60 mL/min   Anion gap 9 5 - 15   CT Renal Stone Study  Result Date: 06/16/2019 CLINICAL DATA:  Left flank pain EXAM: CT ABDOMEN AND PELVIS WITHOUT CONTRAST TECHNIQUE: Multidetector CT imaging of the abdomen and pelvis was performed following the standard protocol  without IV contrast. COMPARISON:  None. FINDINGS: Lower chest: Calcifications seen along the proximal right coronary artery. Calcification anterior to the pulmonary outflow tract, there is history of pulmonary outflow patching as an infant. Hepatobiliary: No focal liver abnormality.No evidence of biliary obstruction or stone. Pancreas: Unremarkable. Spleen: Unremarkable. Adrenals/Urinary Tract: Negative adrenals. On coronal reformats the left kidney is larger, more lower density, and shows perinephric fluid when compared to the right. No hydronephrosis or stone seen currently, question recent passage. Unremarkable bladder. Stomach/Bowel:  No obstruction. No appendicitis. Vascular/Lymphatic: No acute vascular abnormality. Atherosclerotic calcification. No mass or adenopathy. Reproductive:No pathologic findings. Other: No ascites or pneumoperitoneum. Bilateral inguinal hernia repair Musculoskeletal: No acute abnormalities. IMPRESSION: 1. Subtle left renal findings which may relate to recent passage of a stone. No hydronephrosis or calculus seen currently. 2. Aortic and coronary atherosclerosis. Electronically Signed   By: Marnee Spring M.D.   On: 06/16/2019 06:27    CT Renal Stone Study  Result Date: 06/16/2019 CLINICAL DATA:  Left flank pain EXAM: CT ABDOMEN AND PELVIS WITHOUT CONTRAST TECHNIQUE: Multidetector CT imaging of the abdomen and pelvis was performed following the standard protocol without IV contrast. COMPARISON:  None. FINDINGS: Lower chest: Calcifications seen along the proximal right coronary artery. Calcification anterior to the pulmonary outflow tract, there is history of pulmonary outflow patching as an infant. Hepatobiliary: No focal liver abnormality.No evidence of biliary obstruction or stone. Pancreas: Unremarkable. Spleen: Unremarkable. Adrenals/Urinary Tract: Negative adrenals. On coronal reformats the left kidney is larger, more lower density, and shows perinephric fluid when compared  to the right. No hydronephrosis or  stone seen currently, question recent passage. Unremarkable bladder. Stomach/Bowel:  No obstruction. No appendicitis. Vascular/Lymphatic: No acute vascular abnormality. Atherosclerotic calcification. No mass or adenopathy. Reproductive:No pathologic findings. Other: No ascites or pneumoperitoneum. Bilateral inguinal hernia repair Musculoskeletal: No acute abnormalities. IMPRESSION: 1. Subtle left renal findings which may relate to recent passage of a stone. No hydronephrosis or calculus seen currently. 2. Aortic and coronary atherosclerosis. Electronically Signed   By: Marnee Spring M.D.   On: 06/16/2019 06:27    Procedures Procedures (including critical care time)  Medications Ordered in ED Medications  ketorolac (TORADOL) 30 MG/ML injection 30 mg (30 mg Intravenous Given 06/16/19 0531)    ED Course  I have reviewed the triage vital signs and the nursing notes.  Pertinent labs & imaging results that were available during my care of the patient were reviewed by me and considered in my medical decision making (see chart for details).    Pain is markedly improved.  It is possible the patient passed a stone or this could be MSK in nature.  RX sent to pharmacy for pain medication.  Stable for discharge with close follow up.  Brandon Kemp was evaluated in Emergency Department on 06/16/2019 for the symptoms described in the history of present illness. He was evaluated in the context of the global COVID-19 pandemic, which necessitated consideration that the patient might be at risk for infection with the SARS-CoV-2 virus that causes COVID-19. Institutional protocols and algorithms that pertain to the evaluation of patients at risk for COVID-19 are in a state of rapid change based on information released by regulatory bodies including the CDC and federal and state organizations. These policies and algorithms were followed during the patient's care in the  ED.  Final Clinical Impression(s) / ED Diagnoses Final diagnoses:  Flank pain   Return for weakness, numbness, changes in vision or speech, fevers >100.4 unrelieved by medication, shortness of breath, intractable vomiting, or diarrhea, abdominal pain, Inability to tolerate liquids or food, cough, altered mental status or any concerns. No signs of systemic illness or infection. The patient is nontoxic-appearing on exam and vital signs are within normal limits.   I have reviewed the triage vital signs and the nursing notes. Pertinent labs &imaging results that were available during my care of the patient were reviewed by me and considered in my medical decision making (see chart for details).  After history, exam, and medical workup I feel the patient has been appropriately medically screened and is safe for discharge home. Pertinent diagnoses were discussed with the patient. Patient was givenstrictreturn precautions. Rx / DC Orders ED Discharge Orders         Ordered    Diclofenac Sodium CR 100 MG 24 hr tablet  Daily     06/16/19 0644    ondansetron (ZOFRAN ODT) 8 MG disintegrating tablet     06/16/19 0644           Kimberlye Dilger, MD 06/16/19 7209

## 2019-06-25 ENCOUNTER — Ambulatory Visit: Payer: 59 | Attending: Internal Medicine

## 2019-06-25 DIAGNOSIS — Z23 Encounter for immunization: Secondary | ICD-10-CM

## 2019-06-25 NOTE — Progress Notes (Signed)
   Covid-19 Vaccination Clinic  Name:  Brandon Kemp    MRN: [id number] DOB: 11-Mar-1975  06/25/2019  [name] was observed post Covid-19 immunization for 15 minutes without incident. He was provided with Vaccine Information Sheet and instruction to access the V-Safe system.   [name] was instructed to call 911 with any severe reactions post vaccine: Marland Kitchen Difficulty breathing  . Swelling of face and throat  . A fast heartbeat  . A bad rash all over body  . Dizziness and weakness   Immunizations Administered    Name Date Dose VIS Date Route   Pfizer COVID-19 Vaccine 06/25/2019 10:13 AM 0.3 mL 04/28/2018 Intramuscular   Manufacturer: [employer], [employer]   Lot: [id number]   NDC: [address]-5

## 2019-07-19 ENCOUNTER — Ambulatory Visit: Payer: 59 | Attending: Internal Medicine

## 2020-01-22 ENCOUNTER — Telehealth (HOSPITAL_COMMUNITY): Payer: Self-pay

## 2020-01-22 ENCOUNTER — Ambulatory Visit (HOSPITAL_COMMUNITY)
Admission: RE | Admit: 2020-01-22 | Discharge: 2020-01-22 | Disposition: A | Discharge status: Home / Self Care | Payer: 59 | Source: Ambulatory Visit | Attending: Pulmonary Disease | Admitting: Pulmonary Disease

## 2020-01-22 ENCOUNTER — Telehealth: Payer: Self-pay | Admitting: Adult Health

## 2020-01-22 ENCOUNTER — Other Ambulatory Visit: Payer: Self-pay | Admitting: Physician Assistant

## 2020-01-22 DIAGNOSIS — Q221 Congenital pulmonary valve stenosis: Secondary | ICD-10-CM | POA: Diagnosis not present

## 2020-01-22 DIAGNOSIS — U071 COVID-19: Secondary | ICD-10-CM

## 2020-01-22 DIAGNOSIS — I10 Essential (primary) hypertension: Secondary | ICD-10-CM

## 2020-01-22 DIAGNOSIS — Z6825 Body mass index (BMI) 25.0-25.9, adult: Secondary | ICD-10-CM | POA: Insufficient documentation

## 2020-01-22 DIAGNOSIS — F4323 Adjustment disorder with mixed anxiety and depressed mood: Secondary | ICD-10-CM | POA: Insufficient documentation

## 2020-01-22 MED ORDER — SOTROVIMAB 500 MG/8ML IV SOLN
500.0000 mg | Freq: Once | INTRAVENOUS | Status: AC
  Administered 2020-01-22: 500 mg via INTRAVENOUS

## 2020-01-22 MED ORDER — DIPHENHYDRAMINE HCL 50 MG/ML IJ SOLN: 50.0000 mg | Freq: Once | INTRAMUSCULAR | Status: DC | PRN

## 2020-01-22 MED ORDER — ACETAMINOPHEN 325 MG PO TABS
650.0000 mg | ORAL_TABLET | Freq: Once | ORAL | Status: AC
  Administered 2020-01-22: 650 mg via ORAL
  Filled 2020-01-22: qty 2

## 2020-01-22 MED ORDER — EPINEPHRINE 0.3 MG/0.3ML IJ SOAJ: 0.3000 mg | Freq: Once | INTRAMUSCULAR | Status: DC | PRN

## 2020-01-22 MED ORDER — SODIUM CHLORIDE 0.9 % IV SOLN: INTRAVENOUS | Status: DC | PRN

## 2020-01-22 MED ORDER — ALBUTEROL SULFATE HFA 108 (90 BASE) MCG/ACT IN AERS: 2.0000 | INHALATION_SPRAY | Freq: Once | RESPIRATORY_TRACT | Status: DC | PRN

## 2020-01-22 MED ORDER — METHYLPREDNISOLONE SODIUM SUCC 125 MG IJ SOLR: 125.0000 mg | Freq: Once | INTRAMUSCULAR | Status: DC | PRN

## 2020-01-22 MED ORDER — FAMOTIDINE IN NACL 20-0.9 MG/50ML-% IV SOLN: 20.0000 mg | Freq: Once | INTRAVENOUS | Status: DC | PRN

## 2020-01-22 NOTE — Progress Notes (Signed)
Patient reviewed Fact Sheet for Patients, Parents, and Caregivers for Emergency Use Authorization (EUA) of Sotrovimab for the Treatment of Coronavirus. Patient also reviewed and is agreeable to the estimated cost of treatment. Patient is agreeable to proceed.   

## 2020-01-22 NOTE — Telephone Encounter (Signed)
Called patient to pre-screen for monoclonal antibody infusion after receiving recent positive test. Patient qualifies based off off co-morbid condition and/or member of an at risk group.   Patient Active Problem List   Diagnosis Date Noted  . Attention deficit hyperactivity disorder (ADHD) 12/07/2017  . Adjustment disorder with mixed anxiety and depressed mood 12/07/2017  . Relationship problem between partners 12/07/2017  . Radial styloid tenosynovitis of right hand 07/18/2017  . S/P bilateral inguinal hernia repair, follow-up exam 02/10/2014  . Bilateral inguinal hernia without obstruction or gangrene 12/27/2013  . Hypertension goal BP (blood pressure) < 130/80 12/27/2013  . Carpal tunnel syndrome 08/05/2013  . Migraine with aura 08/05/2013  . Other intervertebral disc degeneration, lumbar region 08/05/2013  . Physiological tremor 08/05/2013  . Ventricular premature beats 09/26/2011  . Atypical chest pain 02/05/2011  . Congenital pulmonary valve stenosis 02/05/2011    Patient is interested in learning more about the infusion. RN forwarded information to APP's for additional screening/scheduling.  Timothey Dahlstrom Loyola Mast, RN

## 2020-01-22 NOTE — Telephone Encounter (Signed)
Patient called to return phone call from APP in regards to covid infusion. Will notify APP to return call to patient.

## 2020-01-22 NOTE — Discharge Instructions (Signed)

## 2020-01-22 NOTE — Telephone Encounter (Signed)
Called and LMOM regarding monoclonal antibody treatment for COVID 19 given to those who are at risk for complications and/or hospitalization of the virus.  Patient meets criteria based on: congenital heart disease, HTN, BMI greater than 25.   Symptom onset 01/20/2020  Call back number given: [phone]  My chart message: unable to send  Lillard Anes, NP

## 2020-01-22 NOTE — Progress Notes (Signed)
I connected by phone with Brandon Kemp on 01/22/2020 at 2:09 PM to discuss the potential use of a new treatment for mild to moderate COVID-19 viral infection in non-hospitalized patients.  This patient is a 44 y.o. male that meets the FDA criteria for Emergency Use Authorization of COVID monoclonal antibody sotrovimab, casirivimab/imdevimab or bamlamivimab/estevimab.  Has a (+) direct SARS-CoV-2 viral test result  Has mild or moderate COVID-19   Is NOT hospitalized due to COVID-19  Is within 10 days of symptom onset  Has at least one of the high risk factor(s) for progression to severe COVID-19 and/or hospitalization as defined in EUA.  Specific high risk criteria : BMI > 25, Cardiovascular disease or hypertension and Other high risk medical condition per CDC:  tobacco abuse   I have spoken and communicated the following to the patient or parent/caregiver regarding COVID monoclonal antibody treatment:  1. FDA has authorized the emergency use for the treatment of mild to moderate COVID-19 in adults and pediatric patients with positive results of direct SARS-CoV-2 viral testing who are 21 years of age and older weighing at least 40 kg, and who are at high risk for progressing to severe COVID-19 and/or hospitalization.  2. The significant known and potential risks and benefits of COVID monoclonal antibody, and the extent to which such potential risks and benefits are unknown.  3. Information on available alternative treatments and the risks and benefits of those alternatives, including clinical trials.  4. Patients treated with COVID monoclonal antibody should continue to self-isolate and use infection control measures (e.g., wear mask, isolate, social distance, avoid sharing personal items, clean and disinfect high touch surfaces, and frequent handwashing) according to CDC guidelines.   5. The patient or parent/caregiver has the option to accept or refuse COVID monoclonal antibody  treatment.  After reviewing this information with the patient, the patient has agreed to receive one of the available covid 19 monoclonal antibodies and will be provided an appropriate fact sheet prior to infusion.  Sx onset 11/18. Set up for infusion on 11/20 @ 3:30pm. Directions given to [facility]. Pt is aware that insurance will be charged an infusion fee. Pt is partially vaccinated.   [name] 01/22/2020 2:09 PM

## 2020-01-22 NOTE — Progress Notes (Addendum)
  Diagnosis: COVID-19  Physician: Dr. Wright  Procedure: Sotrovimab  Complications: No immediate complications noted.  Discharge: Discharged home   Tashona Calk Marie C 01/22/2020  

## 2020-04-13 ENCOUNTER — Other Ambulatory Visit: Payer: Self-pay | Admitting: Cardiovascular Disease

## 2020-05-05 ENCOUNTER — Other Ambulatory Visit: Payer: Self-pay | Admitting: Cardiovascular Disease

## 2020-08-03 ENCOUNTER — Other Ambulatory Visit: Payer: Self-pay | Admitting: Cardiovascular Disease

## 2020-08-17 ENCOUNTER — Other Ambulatory Visit: Payer: Self-pay | Admitting: Cardiovascular Disease

## 2020-10-26 ENCOUNTER — Other Ambulatory Visit: Payer: Self-pay

## 2020-10-27 MED ORDER — AMLODIPINE BESYLATE 5 MG PO TABS: 5.0000 mg | ORAL_TABLET | Freq: Every day | ORAL | 0 refills | Status: DC

## 2020-10-27 MED ORDER — COENZYME Q10 100 MG PO TABS: 200.0000 mg | ORAL_TABLET | Freq: Every day | ORAL | 0 refills | Status: DC

## 2020-10-27 MED ORDER — ATORVASTATIN CALCIUM 10 MG PO TABS: 10.0000 mg | ORAL_TABLET | Freq: Every day | ORAL | 0 refills | Status: DC

## 2020-11-28 ENCOUNTER — Other Ambulatory Visit: Payer: Self-pay | Admitting: Cardiovascular Disease

## 2021-03-01 ENCOUNTER — Other Ambulatory Visit: Payer: Self-pay | Admitting: Cardiovascular Disease

## 2021-03-08 ENCOUNTER — Other Ambulatory Visit: Payer: Self-pay | Admitting: Cardiovascular Disease

## 2021-04-15 IMAGING — CT CT RENAL STONE PROTOCOL
2 of 4 series · 17 of 46 positions shown, 19 images · non-contrast
Comparison: None.

CLINICAL DATA: Left flank pain

EXAM:
CT ABDOMEN AND PELVIS WITHOUT CONTRAST
TECHNIQUE: Multidetector CT imaging of the abdomen and pelvis was performed
following the standard protocol without IV contrast.

[Series 2: axial st · axial · 0.89mm/px · z∈[-518,-38]mm · 14 of 106 slices shown, 16 images]
[im 5/106  soft-tissue]
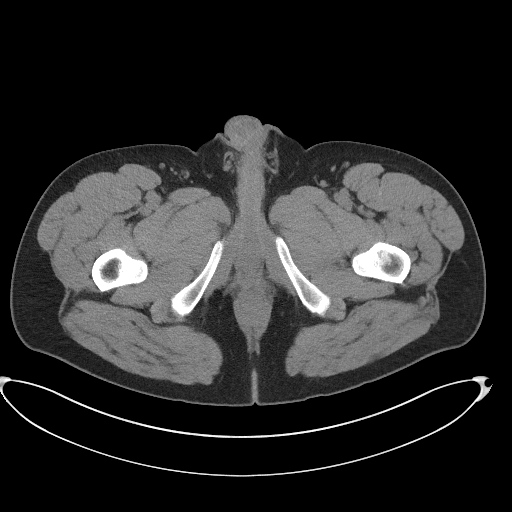
[im 5/106  bone]
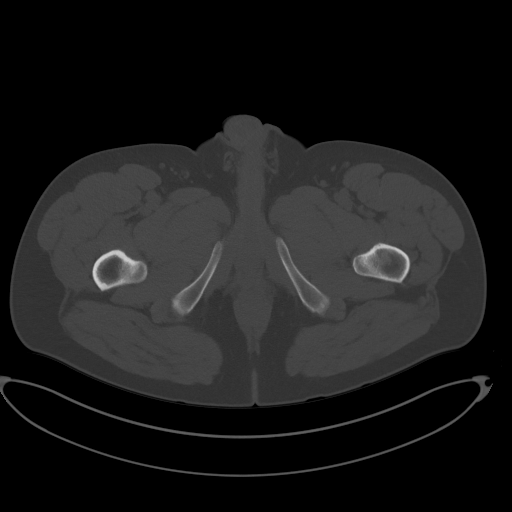
[im 13/106  soft-tissue]
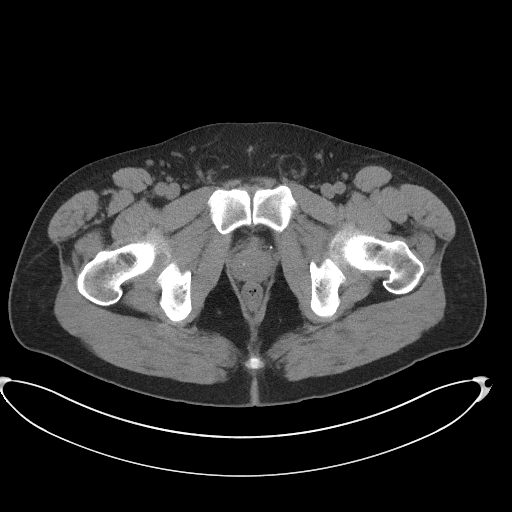
[im 22/106  soft-tissue]
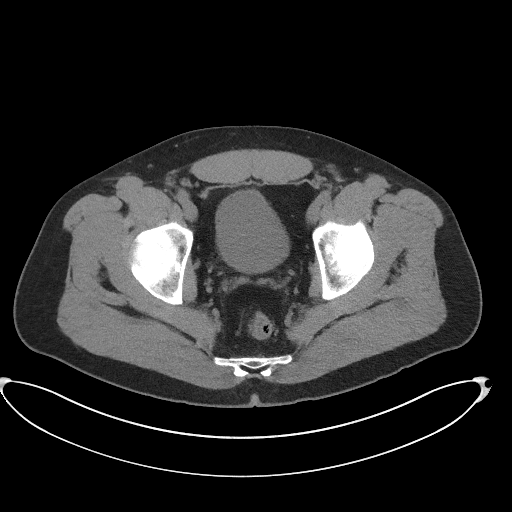
[im 30/106  soft-tissue]
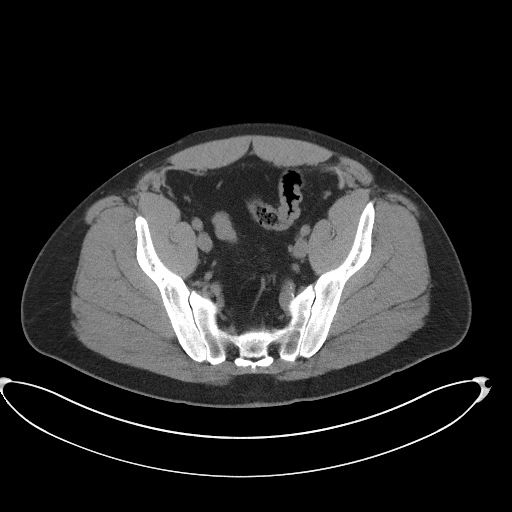
[im 34/106  soft-tissue]
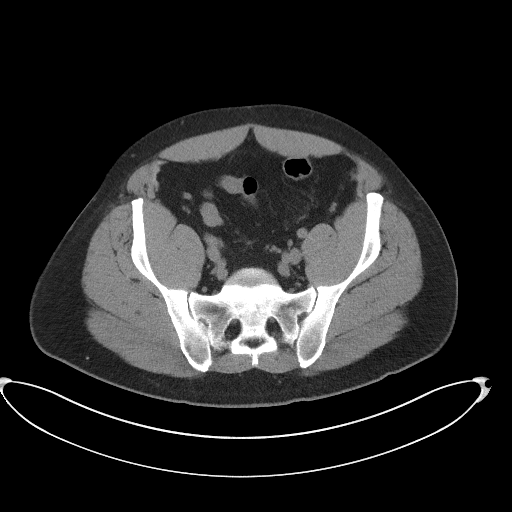
[im 43/106  soft-tissue]
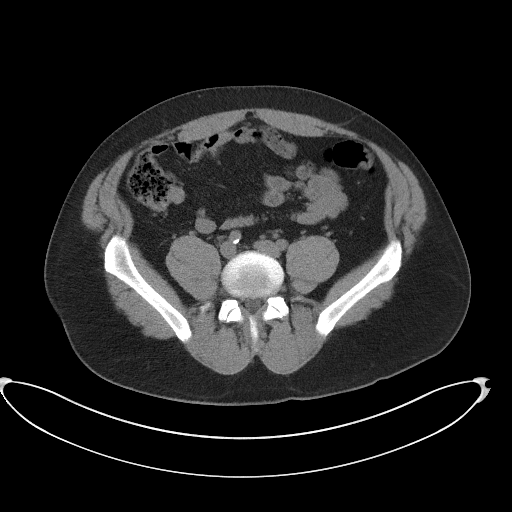
[im 51/106  soft-tissue]
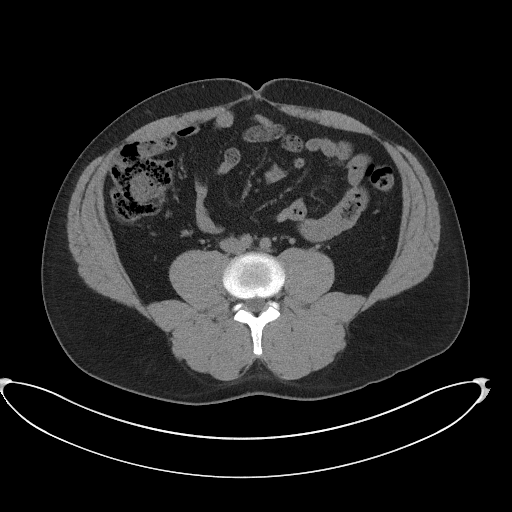
[im 55/106  soft-tissue]
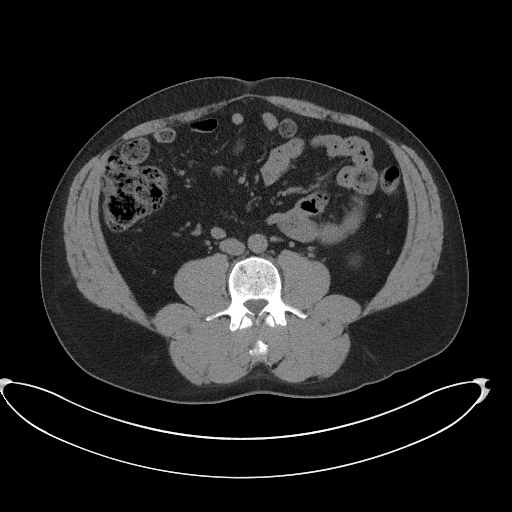
[im 64/106  soft-tissue]
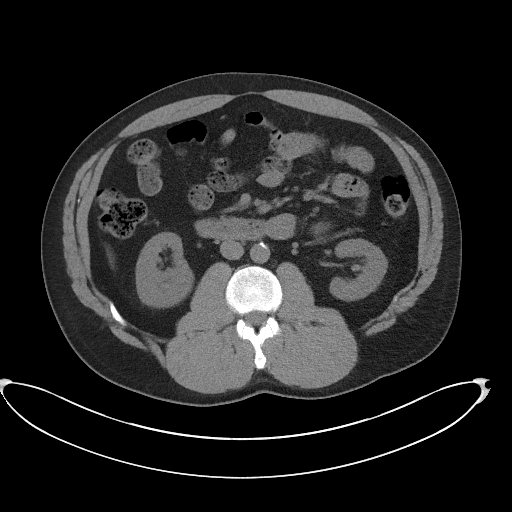
[im 64/106  bone]
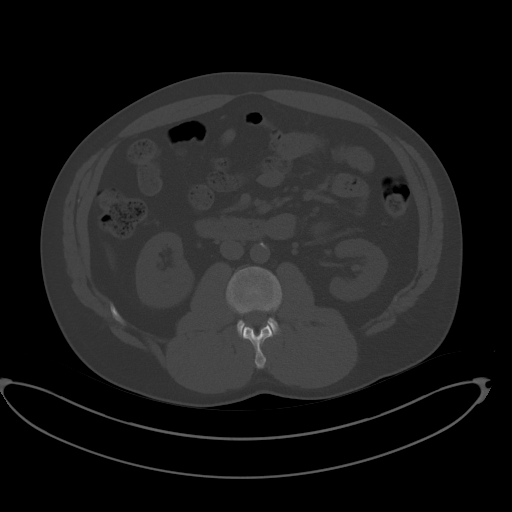
[im 72/106  soft-tissue]
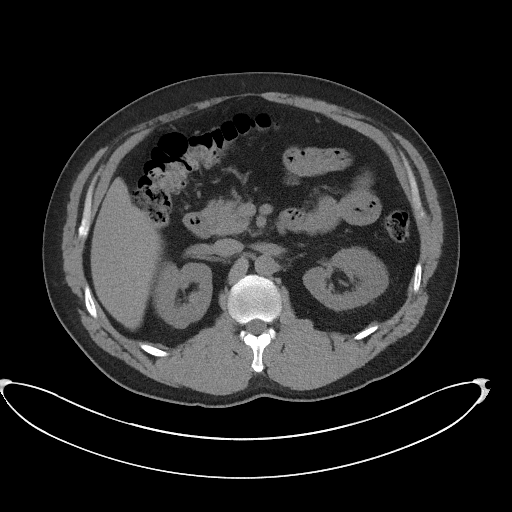
[im 80/106  soft-tissue]
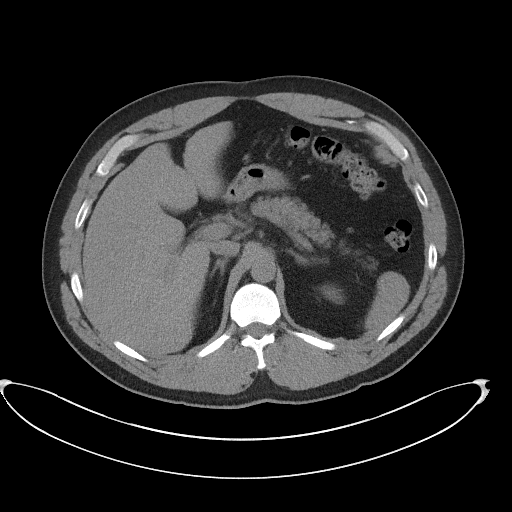
[im 85/106  soft-tissue]
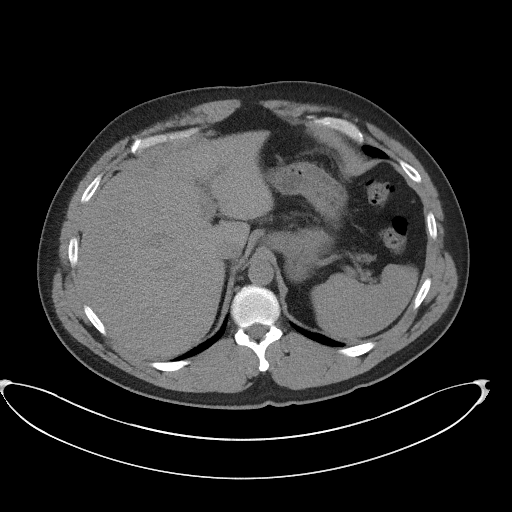
[im 93/106  soft-tissue]
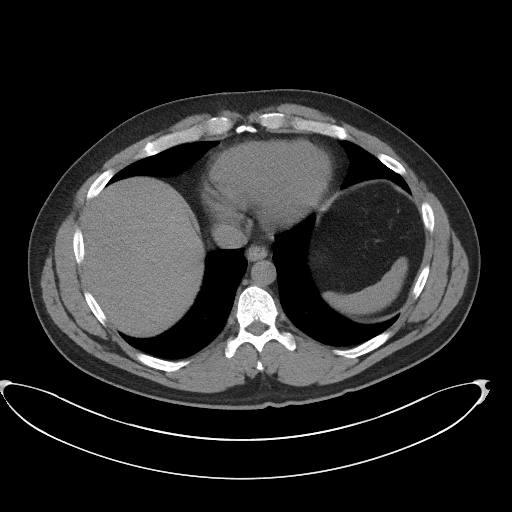
[im 101/106  soft-tissue]
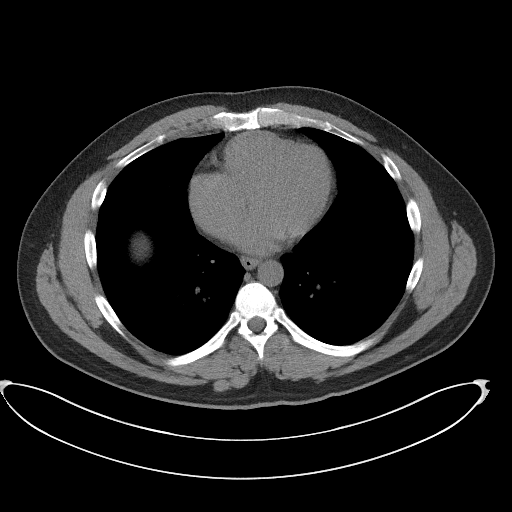

[Series 4: coronal st · coronal · 1.02mm/px · 3 of 96 slices shown]
[im 32/96  soft-tissue]
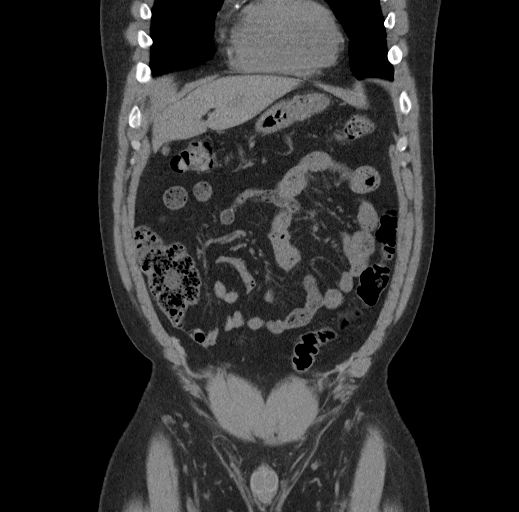
[im 43/96  soft-tissue]
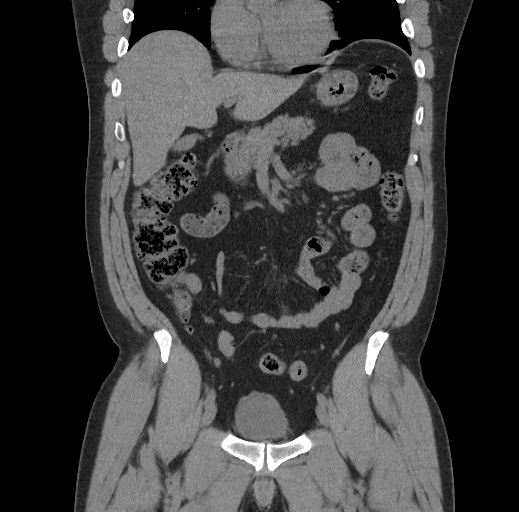
[im 53/96  soft-tissue]
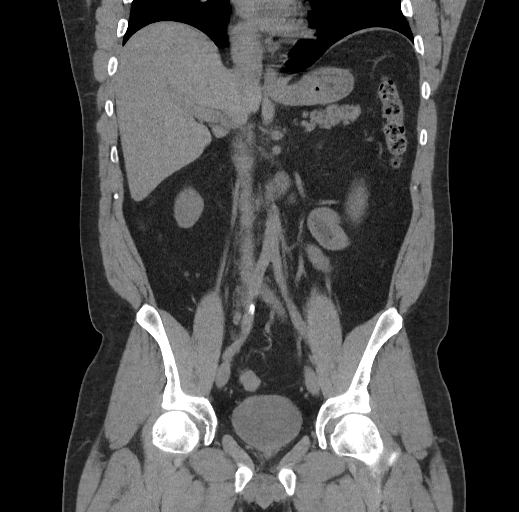

[17 of 46 positions shown; findings below may reference images not displayed]

FINDINGS: Lower chest: Calcifications seen along the proximal right coronary
artery. Calcification anterior to the pulmonary outflow tract, there
is history of pulmonary outflow patching as an infant.

Hepatobiliary: No focal liver abnormality.No evidence of biliary
obstruction or stone.

Pancreas: Unremarkable.

Spleen: Unremarkable.

Adrenals/Urinary Tract: Negative adrenals. On coronal reformats the
left kidney is larger, more lower density, and shows perinephric
fluid when compared to the right. No hydronephrosis or stone seen
currently, question recent passage. Unremarkable bladder.

Stomach/Bowel:  No obstruction. No appendicitis.

Vascular/Lymphatic: No acute vascular abnormality. Atherosclerotic
calcification. No mass or adenopathy.

Reproductive:No pathologic findings.

Other: No ascites or pneumoperitoneum. Bilateral inguinal hernia
repair

Musculoskeletal: No acute abnormalities.
IMPRESSION: 1. Subtle left renal findings which may relate to recent passage of
a stone. No hydronephrosis or calculus seen currently.
2. Aortic and coronary atherosclerosis.

## 2021-04-23 DIAGNOSIS — L578 Other skin changes due to chronic exposure to nonionizing radiation: Secondary | ICD-10-CM | POA: Diagnosis not present

## 2021-04-23 DIAGNOSIS — L918 Other hypertrophic disorders of the skin: Secondary | ICD-10-CM | POA: Diagnosis not present

## 2021-04-23 DIAGNOSIS — Z808 Family history of malignant neoplasm of other organs or systems: Secondary | ICD-10-CM | POA: Diagnosis not present

## 2021-04-23 DIAGNOSIS — L570 Actinic keratosis: Secondary | ICD-10-CM | POA: Diagnosis not present

## 2021-04-23 DIAGNOSIS — D239 Other benign neoplasm of skin, unspecified: Secondary | ICD-10-CM | POA: Diagnosis not present

## 2021-07-25 ENCOUNTER — Other Ambulatory Visit: Payer: Self-pay | Admitting: Cardiovascular Disease

## 2021-07-27 ENCOUNTER — Ambulatory Visit (INDEPENDENT_AMBULATORY_CARE_PROVIDER_SITE_OTHER): Payer: BC Managed Care – PPO | Admitting: Cardiovascular Disease

## 2021-07-27 ENCOUNTER — Encounter: Payer: Self-pay | Admitting: Cardiovascular Disease

## 2021-07-27 VITALS — BP 130/100 | HR 80 | Ht 71.0 in | Wt 238.6 lb

## 2021-07-27 DIAGNOSIS — R931 Abnormal findings on diagnostic imaging of heart and coronary circulation: Secondary | ICD-10-CM

## 2021-07-27 DIAGNOSIS — I10 Essential (primary) hypertension: Secondary | ICD-10-CM

## 2021-07-27 DIAGNOSIS — Q221 Congenital pulmonary valve stenosis: Secondary | ICD-10-CM

## 2021-07-27 DIAGNOSIS — R9431 Abnormal electrocardiogram [ECG] [EKG]: Secondary | ICD-10-CM | POA: Diagnosis not present

## 2021-07-27 MED ORDER — ROSUVASTATIN CALCIUM 10 MG PO TABS: 10.0000 mg | ORAL_TABLET | Freq: Every day | ORAL | 3 refills | Status: DC

## 2021-07-27 MED ORDER — TELMISARTAN 40 MG PO TABS: 40.0000 mg | ORAL_TABLET | Freq: Every day | ORAL | 3 refills | Status: DC

## 2021-07-27 NOTE — Patient Instructions (Signed)
Medication Instructions:  START Telmisartan (micardis) 40mg  daily START Rosuvastatin (crestor) 10mg  daily *If you need a refill on your cardiac medications before your next appointment, please call your pharmacy*   Lab Work: CMET, Lipids in 3 months If you have labs (blood work) drawn today and your tests are completely normal, you will receive your results only by: MyChart Message (if you have MyChart) OR A paper copy in the mail If you have any lab test that is abnormal or we need to change your treatment, we will call you to review the results.   Testing/Procedures: Coronary Calcium score CT Non-Cardiac CT scanning, (CAT scanning), is a noninvasive, special x-ray that produces cross-sectional images of the body using x-rays and a computer. CT scans help physicians diagnose and treat medical conditions. For some CT exams, a contrast material is used to enhance visibility in the area of the body being studied. CT scans provide greater clarity and reveal more details than regular x-ray exams.  ECHO Your physician has requested that you have an echocardiogram. Echocardiography is a painless test that uses sound waves to create images of your heart. It provides your doctor with information about the size and shape of your heart and how well your heart's chambers and valves are working. This procedure takes approximately one hour. There are no restrictions for this procedure.  Follow-Up: At [facility], you and your health needs are our priority.  As part of our continuing mission to provide you with exceptional heart care, we have created designated Provider Care Teams.  These Care Teams include your primary Cardiologist (physician) and Advanced Practice Providers (APPs -  Physician Assistants and Nurse Practitioners) who all work together to provide you with the care you need, when you need it.  We recommend signing up for the patient portal called "MyChart".  Sign up information is  provided on this After Visit Summary.  MyChart is used to connect with patients for Virtual Visits (Telemedicine).  Patients are able to view lab/test results, encounter notes, upcoming appointments, etc.  Non-urgent messages can be sent to your provider as well.   To learn more about what you can do with MyChart, go to .    Your next appointment:   6 month(s)  The format for your next appointment:   In Person  Provider:   [facility], MD    Important Information About Sugar

## 2021-07-27 NOTE — Progress Notes (Signed)
Cardiology Office Note:    Date:  07/27/2021   ID:  Brandon Driveravid R Thackeray Kemp, DOB January 14, 1976, MRN [id number]  PCP:  Patient, No Pcp Per (Inactive)   [facility] Providers Cardiologist:  Tonny BollmanMichael Worth Kober, MD     Referring MD: No ref. provider found   Chief Complaint  Patient presents with   Hypertension    History of Present Illness:    Brandon DriverDavid R Lovan Kemp is a 46 y.o. male with a hx of hypertension and congenital pulmonic stenosis, presenting for follow-up evaluation.   The patient has a history of congenital pulmonic stenosis and underwent surgical repair at age [age]. An echocardiogram in 2013 showed normal left ventricular function, mild LVH, and moderate pulmonic regurgitation without significant pulmonic stenosis. He's undergone exercise stress testing that showed good exercise tolerance without significant ST or T-wave changes. Because of frequent palpitations, he underwent a Holter monitor showing rare PVCs. There is no sustained arrhythmias or significant pauses. A renal arterial duplex in 2013 was normal with no evidence of renal artery stenosis. 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  A coronary calcium scan at that time showed a score of 31 which placed him at the 96 percentile for age and gender.  The patient is here alone today.  He denies chest pain, chest pressure, or shortness of breath.  No edema, heart palpitations, orthopnea, or PND.  He has been off of all of his antihypertensive medicines now for a few years.  He has not been taking any cholesterol medication.  He remains active but has not engaged in regular exercise at this time.  Past Medical History:  Diagnosis Date   Chest pain    a. 09/2011 ETT @ [facility]: Walked 13 mins, rare PVC's, couplet (symptomatic), no acute st/t changes.   Congenital pulmonic valve stenosis    a. s/p surgical repair @ Age 59 in [place];  b.  12/12  Echo: EF 55-60%, nl wall motion, mild LVH, moderate PR.   History of tobacco  abuse    Hypertension    Relationship problem between partners 12/07/2017   Symptomatic PVCs    a. 09/2011    Past Surgical History:  Procedure Laterality Date   ANKLE SURGERY     HAND SURGERY     pulmonary outflow patch  age 89   pulmonary valvotomy  age 89    Current Medications: Current Meds  Medication Sig   Coenzyme Q10 100 MG TABS Take 2 tablets (200 mg total) by mouth daily.   methylphenidate (RITALIN LA) 10 MG 24 hr capsule Take 10 mg by mouth daily.    rosuvastatin (CRESTOR) 10 MG tablet Take 1 tablet (10 mg total) by mouth daily.   sildenafil (REVATIO) 20 MG tablet    telmisartan (MICARDIS) 40 MG tablet Take 1 tablet (40 mg total) by mouth daily.     Allergies:   Patient has no known allergies.   Social History   Socioeconomic History   Marital status: Married    Spouse name: Not on file   Number of children: Not on file   Years of education: Not on file   Highest education level: Bachelor's degree (e.g., BA, AB, BS)  Occupational History   Not on file  Tobacco Use   Smoking status: Former   Smokeless tobacco: Never   Tobacco comments:    sometimes, when stressed about 2 cigarettes a day  Substance and Sexual Activity   Alcohol use: No    Comment:  social   Drug use: No   Sexual activity: Not Currently    Birth control/protection: Abstinence  Other Topics Concern   Not on file  Social History Narrative   Not on file   Social Determinants of Health   Financial Resource Strain: Not on file  Food Insecurity: Not on file  Transportation Needs: Not on file  Physical Activity: Not on file  Stress: Not on file  Social Connections: Not on file     Family History: The patient's family history includes Hypertension in his father; Evelene Croon Parkinson White syndrome in his brother.  ROS:   Please see the history of present illness.    All other systems reviewed and are negative.  EKGs/Labs/Other Studies Reviewed:    The following studies were reviewed  today: CT Calcium Score 08/08/2014: IMPRESSION: Coronary calcium score of 31. This was 96th percentile for age and sex matched control.  EKG:  EKG is ordered today.  The ekg ordered today demonstrates NSR 80 bpm, incomplete RBBB, RVH, ST-T wave abnormality consider strain versus lateral ischemia  Recent Labs: No results found for requested labs within last 8760 hours.  Recent Lipid Panel    Component Value Date/Time   CHOL 265 (H) 02/17/2019 1143   TRIG 166 (H) 02/17/2019 1143   HDL 45 02/17/2019 1143   CHOLHDL 5.9 (H) 02/17/2019 1143   CHOLHDL 5 09/09/2014 0902   VLDL 23.6 09/09/2014 0902   LDLCALC 189 (H) 02/17/2019 1143   LDLDIRECT 155.7 09/26/2011 1031     Risk Assessment/Calculations:           Physical Exam:    VS:  BP (!) 130/100   Pulse 80   Ht  (1.803 m)   Wt 238 lb 9.6 oz (108.2 kg)   SpO2 96%   BMI 33.28 kg/m     Wt Readings from Last 3 Encounters:  07/27/21 238 lb 9.6 oz (108.2 kg)  06/16/19 225 lb (102.1 kg)  02/15/19 228 lb (103.4 kg)     GEN:  Well nourished, well developed in no acute distress HEENT: Normal NECK: No JVD; No carotid bruits LYMPHATICS: No lymphadenopathy CARDIAC: RRR, no murmurs, rubs, gallops RESPIRATORY:  Clear to auscultation without rales, wheezing or rhonchi  ABDOMEN: Soft, non-tender, non-distended MUSCULOSKELETAL:  No edema; No deformity  SKIN: Warm and dry NEUROLOGIC:  Alert and oriented x 3 PSYCHIATRIC:  Normal affect   ASSESSMENT:    1. Essential hypertension   2. Congenital pulmonary valve stenosis   3. Elevated coronary artery calcium score   4. Nonspecific abnormal electrocardiogram (ECG) (EKG)    PLAN:    In order of problems listed above:  The patient's blood pressure is uncontrolled.  We discussed lifestyle modification today.  He has discontinued all of his medications.  In the past he has been on losartan and amlodipine.  He is also been on a beta-blocker in the past.  I think we should try him on  Micardis and will initiate this at a dose of 40 mg daily.  I will check a metabolic panel in about 3 months.  I emphasized the importance of medication adherence. I recommended a repeat echocardiogram.  It has been many years since his last study. We will repeat a coronary calcium scan.  Last study was 7 years ago.  We will reinitiate statin therapy.  See below for discussion. Suspect this is related to LVH with repolarization changes.  Will check echocardiogram.  Treat blood pressure as above. Lipids have  been elevated in the past.  Last lipid panel from 2020 showed a cholesterol of 265, LDL 189.  Start rosuvastatin 10 mg daily.  Repeat lipids and LFTs in 3 months.           Medication Adjustments/Labs and Tests Ordered: Current medicines are reviewed at length with the patient today.  Concerns regarding medicines are outlined above.  Orders Placed This Encounter  Procedures   CT CARDIAC SCORING (SELF PAY ONLY)   Lipid panel   Comprehensive metabolic panel   EKG 12-Lead   ECHOCARDIOGRAM COMPLETE   Meds ordered this encounter  Medications   telmisartan (MICARDIS) 40 MG tablet    Sig: Take 1 tablet (40 mg total) by mouth daily.    Dispense:  90 tablet    Refill:  3   rosuvastatin (CRESTOR) 10 MG tablet    Sig: Take 1 tablet (10 mg total) by mouth daily.    Dispense:  90 tablet    Refill:  3    Patient Instructions  Medication Instructions:  START Telmisartan (micardis) 40mg  daily START Rosuvastatin (crestor) 10mg  daily *If you need a refill on your cardiac medications before your next appointment, please call your pharmacy*   Lab Work: CMET, Lipids in 3 months If you have labs (blood work) drawn today and your tests are completely normal, you will receive your results only by: MyChart Message (if you have MyChart) OR A paper copy in the mail If you have any lab test that is abnormal or we need to change your treatment, we will call you to review the  results.   Testing/Procedures: Coronary Calcium score CT Non-Cardiac CT scanning, (CAT scanning), is a noninvasive, special x-ray that produces cross-sectional images of the body using x-rays and a computer. CT scans help physicians diagnose and treat medical conditions. For some CT exams, a contrast material is used to enhance visibility in the area of the body being studied. CT scans provide greater clarity and reveal more details than regular x-ray exams.  ECHO Your physician has requested that you have an echocardiogram. Echocardiography is a painless test that uses sound waves to create images of your heart. It provides your doctor with information about the size and shape of your heart and how well your heart's chambers and valves are working. This procedure takes approximately one hour. There are no restrictions for this procedure.  Follow-Up: At [facility], you and your health needs are our priority.  As part of our continuing mission to provide you with exceptional heart care, we have created designated Provider Care Teams.  These Care Teams include your primary Cardiologist (physician) and Advanced Practice Providers (APPs -  Physician Assistants and Nurse Practitioners) who all work together to provide you with the care you need, when you need it.  We recommend signing up for the patient portal called "MyChart".  Sign up information is provided on this After Visit Summary.  MyChart is used to connect with patients for Virtual Visits (Telemedicine).  Patients are able to view lab/test results, encounter notes, upcoming appointments, etc.  Non-urgent messages can be sent to your provider as well.   To learn more about what you can do with MyChart, go to .    Your next appointment:   6 month(s)  The format for your next appointment:   In Person  Provider:   [facility], MD    Important Information About Sugar         Signed, [email or web address], MD  07/27/2021 4:59 PM    [facility]

## 2021-08-03 ENCOUNTER — Telehealth: Payer: Self-pay | Admitting: Psychiatry

## 2021-08-03 NOTE — Telephone Encounter (Signed)
Admin note for non-therapy contact  Patient ID: NAYEF COLLEGE II  MRN: [id number] DATE: 08/03/2021  Messages received late yesterday afternoon PT requesting record release to his attorney, on request of his attorney, for a custody hearing scheduled in 2 weeks.  RTC to PT today to clarify request, purposes of release, and foreseeable risks to his best interest.  Given that record of treatment is over 47 years old, and treatment was not planned or executed with child custody determinations in mind nor involving the children in question, seems very unlikely that it is any valid forensic use, plus PT has clear rights to privacy.  Offered, if it may better suit the purposes at hand and reduce disruption to clinical operations, to write a brief statement of verifying that he was in treatment here and any necessary parameters or specifications.  Verbal consent given to contact his attorney for further clarification.  For himself, Rahil sounds well and volunteers that he is functioning well emotionally and cognitively on an extended release methylphenidate, with no need for antidepressants, and that separation itself substantially resolved the stress syndrome he sought treatment for.  Interest expressed in renewing service, clear that it is not urgent, and able to wait given current availability, which is about 8 weeks out from now.  Call made to attorney's office, [employer], [place] Sic attorney of record.  Message left with office staff Jessica at ph [phone].  Robley Fries, PhD Marliss Czar, PhD LP Clinical Psychologist, [facility] [address], [address] [place], [place] [address] [phone]

## 2021-08-17 ENCOUNTER — Inpatient Hospital Stay: Admission: RE | Admit: 2021-08-17 | Payer: Self-pay | Source: Ambulatory Visit

## 2021-08-17 ENCOUNTER — Ambulatory Visit (HOSPITAL_COMMUNITY): Payer: BC Managed Care – PPO

## 2021-08-23 DIAGNOSIS — F900 Attention-deficit hyperactivity disorder, predominantly inattentive type: Secondary | ICD-10-CM | POA: Diagnosis not present

## 2021-08-30 ENCOUNTER — Ambulatory Visit (INDEPENDENT_AMBULATORY_CARE_PROVIDER_SITE_OTHER)
Admission: RE | Admit: 2021-08-30 | Discharge: 2021-08-30 | Disposition: A | Discharge status: Home / Self Care | Payer: Self-pay | Source: Ambulatory Visit | Attending: Cardiovascular Disease | Admitting: Cardiovascular Disease

## 2021-08-30 ENCOUNTER — Ambulatory Visit (HOSPITAL_COMMUNITY): Payer: BC Managed Care – PPO | Attending: Cardiovascular Disease

## 2021-08-30 DIAGNOSIS — R931 Abnormal findings on diagnostic imaging of heart and coronary circulation: Secondary | ICD-10-CM

## 2021-08-30 DIAGNOSIS — R9431 Abnormal electrocardiogram [ECG] [EKG]: Secondary | ICD-10-CM

## 2021-08-30 DIAGNOSIS — I10 Essential (primary) hypertension: Secondary | ICD-10-CM | POA: Insufficient documentation

## 2021-08-30 DIAGNOSIS — Q221 Congenital pulmonary valve stenosis: Secondary | ICD-10-CM | POA: Diagnosis not present

## 2021-08-30 LAB — ECHOCARDIOGRAM COMPLETE
Area-P 1/2: 3.6 cm2
S' Lateral: 3 cm

## 2021-09-10 ENCOUNTER — Encounter: Payer: Self-pay | Admitting: Cardiovascular Disease

## 2021-09-10 ENCOUNTER — Telehealth: Payer: Self-pay

## 2021-09-10 DIAGNOSIS — R931 Abnormal findings on diagnostic imaging of heart and coronary circulation: Secondary | ICD-10-CM

## 2021-09-10 NOTE — Telephone Encounter (Signed)
-----   Message from [name], MD sent at 09/08/2021  1:20 PM EDT ----- LVEF mildly depressed. Otherwise no significant abnormalities. Pt has had uncontrolled HTN. Now on amlodipine and telmisartan. Noted to have high coronary Ca score. Needs ischemic assessment. Recommend exercise Myoview stress test. thx

## 2021-09-10 NOTE — Telephone Encounter (Signed)
Spoke with patient about results and recommendation for exercise Myoview stress test. Pt understands and order has been placed. Instruction letter sent via MyChart.

## 2021-09-11 ENCOUNTER — Encounter (HOSPITAL_COMMUNITY): Payer: Self-pay | Admitting: Cardiovascular Disease

## 2021-09-11 ENCOUNTER — Other Ambulatory Visit: Payer: Self-pay | Admitting: Cardiovascular Disease

## 2021-09-11 DIAGNOSIS — R079 Chest pain, unspecified: Secondary | ICD-10-CM

## 2021-09-14 ENCOUNTER — Ambulatory Visit (HOSPITAL_COMMUNITY): Payer: BC Managed Care – PPO | Attending: Cardiovascular Disease

## 2021-09-14 DIAGNOSIS — R931 Abnormal findings on diagnostic imaging of heart and coronary circulation: Secondary | ICD-10-CM | POA: Diagnosis not present

## 2021-09-14 LAB — MYOCARDIAL PERFUSION IMAGING
Angina Index: 2
Estimated workload: 10.1
Exercise duration (min): 8 min
Exercise duration (sec): 16 s
LV dias vol: 84 mL (ref 62–150)
LV sys vol: 41 mL
MPHR: 174 {beats}/min
Nuc Stress EF: 52 %
Peak HR: 155 {beats}/min
Percent HR: 89 %
Rest HR: 71 {beats}/min
Rest Nuclear Isotope Dose: 10.2 mCi
SDS: 5
SRS: 0
SSS: 5
Stress Nuclear Isotope Dose: 31.4 mCi
TID: 0.98

## 2021-09-14 MED ORDER — TECHNETIUM TC 99M TETROFOSMIN IV KIT
31.4000 | PACK | Freq: Once | INTRAVENOUS | Status: AC | PRN
  Administered 2021-09-14: 31.4 via INTRAVENOUS

## 2021-09-14 MED ORDER — TECHNETIUM TC 99M TETROFOSMIN IV KIT
10.2000 | PACK | Freq: Once | INTRAVENOUS | Status: AC | PRN
  Administered 2021-09-14: 10.2 via INTRAVENOUS

## 2021-09-19 ENCOUNTER — Telehealth: Payer: Self-pay

## 2021-09-19 NOTE — Telephone Encounter (Signed)
-----   Message from [name], MD sent at 09/19/2021 11:04 AM EDT ----- Findings reviewed with patient.  With marked increase in coronary calcification, now with intermediate risk stress test demonstrating LAD territory ischemia, cardiac catheterization and possible PCI as indicated.  I reviewed the results with him, discussed procedural risks of cardiac catheterization, possible PTCA and stenting.  Through shared decision-making discussion, the patient is agreeable to proceed with cardiac catheterization.  Please reach out to him to schedule.  Should be able to do next week either on Monday or Wednesday.  Additionally, he should start aspirin 81 mg daily.  Thanks

## 2021-09-19 NOTE — Telephone Encounter (Signed)
Called and spoke with patient. He agrees to cath which has been scheduled for 09/26/21 @11 :30 w/Cooper. Patient also now needs an OV with EKG and labs which has been scheduled with [facility] on 7/21 @4 :30. Cath instructions sent to patient via MyChart and left voicemail for him to call if any questions prior to Friday's visit.

## 2021-09-21 ENCOUNTER — Ambulatory Visit: Payer: BC Managed Care – PPO | Admitting: Cardiology

## 2021-09-21 ENCOUNTER — Encounter: Payer: Self-pay | Admitting: Cardiology

## 2021-09-21 ENCOUNTER — Ambulatory Visit (INDEPENDENT_AMBULATORY_CARE_PROVIDER_SITE_OTHER): Payer: BC Managed Care – PPO | Admitting: Cardiology

## 2021-09-21 VITALS — BP 120/80 | HR 92 | Ht 71.0 in | Wt 239.0 lb

## 2021-09-21 DIAGNOSIS — I2510 Atherosclerotic heart disease of native coronary artery without angina pectoris: Secondary | ICD-10-CM | POA: Diagnosis not present

## 2021-09-21 DIAGNOSIS — I10 Essential (primary) hypertension: Secondary | ICD-10-CM | POA: Diagnosis not present

## 2021-09-21 DIAGNOSIS — I2583 Coronary atherosclerosis due to lipid rich plaque: Secondary | ICD-10-CM

## 2021-09-21 DIAGNOSIS — Q221 Congenital pulmonary valve stenosis: Secondary | ICD-10-CM

## 2021-09-21 DIAGNOSIS — I420 Dilated cardiomyopathy: Secondary | ICD-10-CM

## 2021-09-21 DIAGNOSIS — E7800 Pure hypercholesterolemia, unspecified: Secondary | ICD-10-CM | POA: Diagnosis not present

## 2021-09-21 NOTE — Progress Notes (Unsigned)
Cardiology Office Note:    Date:  09/21/2021   ID:  Brandon Kemp, DOB 01/04/1976, MRN [id number]  PCP:  Patient, No Pcp Per   [facility] Providers Cardiologist:  Michael Cooper, MD     Referring MD: No ref. provider found   Chief Complaint  Patient presents with   Coronary Artery Disease   Hypertension   Hyperlipidemia   Cardiomyopathy    History of Present Illness:    Brandon Kemp is a 46 y.o. male with a hx of hypertension and congenital pulmonic stenosis and underwent surgical repair at age 2. An echo in 2013 showed normal left ventricular function, mild LVH, and moderate pulmonic regurgitation without significant pulmonic stenosis.   He's undergone exercise stress testing that showed good exercise tolerance without significant ST or T-wave changes. Because of frequent palpitations, he underwent a Holter monitor showing rare PVCs. There were no sustained arrhythmias or significant pauses.   A renal arterial duplex in 2013 was normal with no evidence of renal artery stenosis. 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  A coronary calcium scan at that time showed a score of 31 which placed him at the 96 percentile for age and gender.  The patient was recently seen by Dr. Cooper in May and blood pressure was poorly controlled.  He had discontinued all his medications.  He was started on Micardis 40 mg daily.  Repeat coronary calcium scan was also ordered since his last clinic been 7 years prior.  2D echo was also ordered due to poorly controlled hypertension which showed mildly reduced LV dysfunction with EF 45 to 50% with global hypokinesis and no focal regional wall motion abnormalities.  There was mild pulmonic stenosis.  Coronary calcium score was obtained showing a coronary calcium score of 867.  Nuclear stress testing was done which showed LAD territory ischemia.  Dr. Cooper talk to patient about cardiac catheterization reviewed the risk and  benefits of cath and possible PTA with stenting.  He is now here for history and physical to be done prior to cardiac cath next week.   Past Medical History:  Diagnosis Date   Chest pain    a. 09/2011 ETT @ [facility]: Walked 13 mins, rare PVC's, couplet (symptomatic), no acute st/t changes.   Congenital pulmonic valve stenosis    a. s/p surgical repair @ Age 2 in [place];  b.  12/12  Echo: EF 55-60%, nl wall motion, mild LVH, moderate PR.   History of tobacco abuse    Hypertension    Relationship problem between partners 12/07/2017   Symptomatic PVCs    a. 09/2011    Past Surgical History:  Procedure Laterality Date   ANKLE SURGERY     HAND SURGERY     pulmonary outflow patch  age 2   pulmonary valvotomy  age 2    Current Medications: No outpatient medications have been marked as taking for the 09/21/21 encounter (Office Visit) with Caidin Heidenreich R, MD.     Allergies:   Patient has no known allergies.   Social History   Socioeconomic History   Marital status: Married    Spouse name: Not on file   Number of children: Not on file   Years of education: Not on file   Highest education level: Bachelor's degree (e.g., BA, AB, BS)  Occupational History   Not on file  Tobacco Use   Smoking status: Former   Smokeless tobacco: Never   Tobacco   comments:    sometimes, when stressed about 2 cigarettes a day  Substance and Sexual Activity   Alcohol use: No    Comment: social   Drug use: No   Sexual activity: Not Currently    Birth control/protection: Abstinence  Other Topics Concern   Not on file  Social History Narrative   Not on file   Social Determinants of Health   Financial Resource Strain: Not on file  Food Insecurity: Not on file  Transportation Needs: Not on file  Physical Activity: Not on file  Stress: Not on file  Social Connections: Not on file     Family History: The patient's family history includes Hypertension in his father; Evelene Croon Parkinson White syndrome  in his brother.  ROS:   Please see the history of present illness.    All other systems reviewed and are negative.  EKGs/Labs/Other Studies Reviewed:    The following studies were reviewed today: CT Calcium Score 08/08/2014: IMPRESSION: Coronary calcium score of 31. This was 96th percentile for age and sex matched control.  EKG:  EKG is ordered today.  The ekg ordered today demonstrates NSR 80 bpm, incomplete RBBB, RVH, ST-T wave abnormality consider strain versus lateral ischemia  Recent Labs: No results found for requested labs within last 365 days.  Recent Lipid Panel    Component Value Date/Time   CHOL 265 (H) 02/17/2019 1143   TRIG 166 (H) 02/17/2019 1143   HDL 45 02/17/2019 1143   CHOLHDL 5.9 (H) 02/17/2019 1143   CHOLHDL 5 09/09/2014 0902   VLDL 23.6 09/09/2014 0902   LDLCALC 189 (H) 02/17/2019 1143   LDLDIRECT 155.7 09/26/2011 1031     Risk Assessment/Calculations:           Physical Exam:    VS:  There were no vitals taken for this visit.    Wt Readings from Last 3 Encounters:  09/14/21 238 lb (108 kg)  07/27/21 238 lb 9.6 oz (108.2 kg)  06/16/19 225 lb (102.1 kg)    GEN: Well nourished, well developed in no acute distress HEENT: Normal NECK: No JVD; No carotid bruits LYMPHATICS: No lymphadenopathy CARDIAC:RRR, no murmurs, rubs, gallops RESPIRATORY:  Clear to auscultation without rales, wheezing or rhonchi  ABDOMEN: Soft, non-tender, non-distended MUSCULOSKELETAL:  No edema; No deformity  SKIN: Warm and dry NEUROLOGIC:  Alert and oriented x 3 PSYCHIATRIC:  Normal affect   ASSESSMENT:    1. Coronary artery disease due to lipid rich plaque   2. Essential hypertension   3. Pure hypercholesterolemia   4. Congenital pulmonary valve stenosis   5. DCM (dilated cardiomyopathy) (HCC)     PLAN:    In order of problems listed above:  Coronary artery disease -He was recently found to have coronary artery calcifications with coronary calcium score of  867. -Nuclear stress test showed EF 52% with small reversible apical defect consistent with ischemia in the LAD territory -Dr. Excell Seltzer discussed the results with the patient and went over risks and benefits of cardiac cath with the patient on 09/19/2021.  The patient was agreeable to cath and is here to get his H&P done. -Currently not having any angina -Continue statin therapy -Start aspirin 81 mg daily -He is scheduled for cardiac cath early next week    2.  Hypertension -BP is controlled on exam today -Continue prescription drug therapy with amlodipine 5 mg daily and telmisartan 40 mg daily with as needed refills  3.  Hyperlipidemia -LDL goal less than 70 -We will  check fasting lipid panel and ALT -Continue prescription drug management with Crestor 10 mg daily with as needed refills  4.  History of congenital pulmonic stenosis -Recent echo showed mild pulmonic stenosis -Continue to follow with serial echoes  5.  Dilated cardiomyopathy -Recent echo showed a decline in EF to 45 to 50% -Likely ischemic related -Continue ARB -Await findings of cardiac cath      Medication Adjustments/Labs and Tests Ordered: Current medicines are reviewed at length with the patient today.  Concerns regarding medicines are outlined above.  No orders of the defined types were placed in this encounter.  No orders of the defined types were placed in this encounter.   There are no Patient Instructions on file for this visit.   Signed, Armanda Magic, MD  09/21/2021 3:59 PM     [facility]

## 2021-09-21 NOTE — Patient Instructions (Signed)
Medication Instructions:  Your physician recommends that you continue on your current medications as directed. Please refer to the Current Medication list given to you today.   *If you need a refill on your cardiac medications before your next appointment, please call your pharmacy*   Lab Work: CBC and BMET - Monday, July 24th between 7:15am and 5:00pm If you have labs (blood work) drawn today and your tests are completely normal, you will receive your results only by: MyChart Message (if you have MyChart) OR A paper copy in the mail If you have any lab test that is abnormal or we need to change your treatment, we will call you to review the results.   Testing/Procedures: Your physician has requested that you have a cardiac catheterization. Cardiac catheterization is used to diagnose and/or treat various heart conditions. Doctors may recommend this procedure for a number of different reasons. The most common reason is to evaluate chest pain. Chest pain can be a symptom of coronary artery disease (CAD), and cardiac catheterization can show whether plaque is narrowing or blocking your heart's arteries. This procedure is also used to evaluate the valves, as well as measure the blood flow and oxygen levels in different parts of your heart. For further information please visit [email or web address] Please follow instruction sheet, as given.  Follow-Up: At [facility], you and your health needs are our priority.  As part of our continuing mission to provide you with exceptional heart care, we have created designated Provider Care Teams.  These Care Teams include your primary Cardiologist (physician) and Advanced Practice Providers (APPs -  Physician Assistants and Nurse Practitioners) who all work together to provide you with the care you need, when you need it.  Follow up with Dr. Excell Seltzer after heart cath  If primary card or EP is not listed click here to update    :1}    Other  Instructions  [facility] CARDIOVASCULAR DIVISION [facility] OFFICE [address] [place] [address] Dept: [phone] Loc: [phone]  ELRIDGE STEMM II  09/21/2021  You are scheduled for a Cardiac Catheterization on Wednesday, July 26 with Dr. [name].  1. Please arrive at the Main Entrance A at [facility]: [address] [place], [place] [address] at 9:30 AM (This time is two hours before your procedure to ensure your preparation). Free valet parking service is available.   Special note: Every effort is made to have your procedure done on time. Please understand that emergencies sometimes delay scheduled procedures.  2. Diet: Do not eat solid foods after midnight.  You may have clear liquids until 5 AM upon the day of the procedure.  3. Labs: You will need to have blood drawn on Monday, July 24 at [facility] at [facility]. [address], [place]  Open: 7:30am - 5pm    Phone: [phone]. You do not need to be fasting.  4. Medication instructions in preparation for your procedure:   Contrast Allergy: No  On the morning of your procedure, take Aspirin and any morning medicines NOT listed above.  You may use sips of water.  5. Plan to go home the same day, you will only stay overnight if medically necessary. 6. You MUST have a responsible adult to drive you home. 7. An adult MUST be with you the first 24 hours after you arrive home. 8. Bring a current list of your medications, and the last  time and date medication taken. 9. Bring ID and current insurance cards. 10.Please wear clothes that are easy to get on and off and wear slip-on shoes.  Thank you for allowing [place] to care for you!   -- [facility]   Important Information About Sugar

## 2021-09-21 NOTE — H&P (View-Only) (Signed)
Cardiology Office Note:    Date:  09/21/2021   ID:  [name], DOB 15-Aug-1975, MRN [id number]  PCP:  Patient, No Pcp Per   Will Providers Cardiologist:  Sherren Mocha, MD     Referring MD: No ref. provider found   Chief Complaint  Patient presents with   Coronary Artery Disease   Hypertension   Hyperlipidemia   Cardiomyopathy    History of Present Illness:    Brandon Kemp is a 46 y.o. 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 An echo in 2013 showed normal left ventricular function, mild LVH, and moderate pulmonic regurgitation without significant pulmonic stenosis.   He's undergone exercise stress testing that showed good exercise tolerance without significant ST or T-wave changes. Because of frequent palpitations, he underwent a Holter monitor showing rare PVCs. There were no sustained arrhythmias or significant pauses.   A renal arterial duplex in 2013 was normal with no evidence of renal artery stenosis. 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  A coronary calcium scan at that time showed a score of 31 which placed him at the 49 percentile for age and gender.  The patient was recently seen by Dr. Burt Knack in May and blood pressure was poorly controlled.  He had discontinued all his medications.  He was started on Micardis 40 mg daily.  Repeat coronary calcium scan was also ordered since his last clinic been 7 years prior.  2D echo was also ordered due to poorly controlled hypertension which showed mildly reduced LV dysfunction with EF 45 to 50% with global hypokinesis and no focal regional wall motion abnormalities.  There was mild pulmonic stenosis.  Coronary calcium score was obtained showing a coronary calcium score of 867.  Nuclear stress testing was done which showed LAD territory ischemia.  Dr. Burt Knack talk to patient about cardiac catheterization reviewed the risk and  benefits of cath and possible PTA with stenting.  He is now here for history and physical to be done prior to cardiac cath next week.   Past Medical History:  Diagnosis Date   Chest pain    a. 09/2011 ETT @ [facility]: Walked 13 mins, rare PVC's, couplet (symptomatic), no acute st/t changes.   Congenital pulmonic valve stenosis    a. s/p surgical repair @ Age 27 in [place];  b.  12/12  Echo: EF 55-60%, nl wall motion, mild LVH, moderate PR.   History of tobacco abuse    Hypertension    Relationship problem between partners 12/07/2017   Symptomatic PVCs    a. 09/2011    Past Surgical History:  Procedure Laterality Date   ANKLE SURGERY     HAND SURGERY     pulmonary outflow patch  age 72   pulmonary valvotomy  age 72    Current Medications: No outpatient medications have been marked as taking for the 09/21/21 encounter (Office Visit) with Sueanne Margarita, MD.     Allergies:   Patient has no known allergies.   Social History   Socioeconomic History   Marital status: Married    Spouse name: Not on file   Number of children: Not on file   Years of education: Not on file   Highest education level: Bachelor's degree (e.g., BA, AB, BS)  Occupational History   Not on file  Tobacco Use   Smoking status: Former   Smokeless tobacco: Never   Tobacco  comments:    sometimes, when stressed about 2 cigarettes a day  Substance and Sexual Activity   Alcohol use: No    Comment: social   Drug use: No   Sexual activity: Not Currently    Birth control/protection: Abstinence  Other Topics Concern   Not on file  Social History Narrative   Not on file   Social Determinants of Health   Financial Resource Strain: Not on file  Food Insecurity: Not on file  Transportation Needs: Not on file  Physical Activity: Not on file  Stress: Not on file  Social Connections: Not on file     Family History: The patient's family history includes Hypertension in his father; Evelene Croon Parkinson White syndrome  in his brother.  ROS:   Please see the history of present illness.    All other systems reviewed and are negative.  EKGs/Labs/Other Studies Reviewed:    The following studies were reviewed today: CT Calcium Score 08/08/2014: IMPRESSION: Coronary calcium score of 31. This was 96th percentile for age and sex matched control.  EKG:  EKG is ordered today.  The ekg ordered today demonstrates NSR 80 bpm, incomplete RBBB, RVH, ST-T wave abnormality consider strain versus lateral ischemia  Recent Labs: No results found for requested labs within last 365 days.  Recent Lipid Panel    Component Value Date/Time   CHOL 265 (H) 02/17/2019 1143   TRIG 166 (H) 02/17/2019 1143   HDL 45 02/17/2019 1143   CHOLHDL 5.9 (H) 02/17/2019 1143   CHOLHDL 5 09/09/2014 0902   VLDL 23.6 09/09/2014 0902   LDLCALC 189 (H) 02/17/2019 1143   LDLDIRECT 155.7 09/26/2011 1031     Risk Assessment/Calculations:           Physical Exam:    VS:  There were no vitals taken for this visit.    Wt Readings from Last 3 Encounters:  09/14/21 238 lb (108 kg)  07/27/21 238 lb 9.6 oz (108.2 kg)  06/16/19 225 lb (102.1 kg)    GEN: Well nourished, well developed in no acute distress HEENT: Normal NECK: No JVD; No carotid bruits LYMPHATICS: No lymphadenopathy CARDIAC:RRR, no murmurs, rubs, gallops RESPIRATORY:  Clear to auscultation without rales, wheezing or rhonchi  ABDOMEN: Soft, non-tender, non-distended MUSCULOSKELETAL:  No edema; No deformity  SKIN: Warm and dry NEUROLOGIC:  Alert and oriented x 3 PSYCHIATRIC:  Normal affect   ASSESSMENT:    1. Coronary artery disease due to lipid rich plaque   2. Essential hypertension   3. Pure hypercholesterolemia   4. Congenital pulmonary valve stenosis   5. DCM (dilated cardiomyopathy) (HCC)     PLAN:    In order of problems listed above:  Coronary artery disease -He was recently found to have coronary artery calcifications with coronary calcium score of  867. -Nuclear stress test showed EF 52% with small reversible apical defect consistent with ischemia in the LAD territory -Dr. Excell Seltzer discussed the results with the patient and went over risks and benefits of cardiac cath with the patient on 09/19/2021.  The patient was agreeable to cath and is here to get his H&P done. -Currently not having any angina -Continue statin therapy -Start aspirin 81 mg daily -He is scheduled for cardiac cath early next week    2.  Hypertension -BP is controlled on exam today -Continue prescription drug therapy with amlodipine 5 mg daily and telmisartan 40 mg daily with as needed refills  3.  Hyperlipidemia -LDL goal less than 70 -We will  check fasting lipid panel and ALT -Continue prescription drug management with Crestor 10 mg daily with as needed refills  4.  History of congenital pulmonic stenosis -Recent echo showed mild pulmonic stenosis -Continue to follow with serial echoes  5.  Dilated cardiomyopathy -Recent echo showed a decline in EF to 45 to 50% -Likely ischemic related -Continue ARB -Await findings of cardiac cath      Medication Adjustments/Labs and Tests Ordered: Current medicines are reviewed at length with the patient today.  Concerns regarding medicines are outlined above.  No orders of the defined types were placed in this encounter.  No orders of the defined types were placed in this encounter.   There are no Patient Instructions on file for this visit.   Signed, Armanda Magic, MD  09/21/2021 3:59 PM     [facility]

## 2021-09-24 ENCOUNTER — Other Ambulatory Visit: Payer: BC Managed Care – PPO

## 2021-09-25 ENCOUNTER — Telehealth: Payer: Self-pay | Admitting: *Deleted

## 2021-09-25 ENCOUNTER — Other Ambulatory Visit: Payer: BC Managed Care – PPO

## 2021-09-25 DIAGNOSIS — I2510 Atherosclerotic heart disease of native coronary artery without angina pectoris: Secondary | ICD-10-CM

## 2021-09-25 DIAGNOSIS — Z01812 Encounter for preprocedural laboratory examination: Secondary | ICD-10-CM

## 2021-09-25 LAB — BASIC METABOLIC PANEL
BUN/Creatinine Ratio: 11 (ref 9–20)
BUN: 12 mg/dL (ref 6–24)
CO2: 26 mmol/L (ref 20–29)
Calcium: 9.6 mg/dL (ref 8.7–10.2)
Chloride: 103 mmol/L (ref 96–106)
Creatinine, Ser: 1.13 mg/dL (ref 0.76–1.27)
Glucose: 146 mg/dL — ABNORMAL HIGH (ref 70–99)
Potassium: 5 mmol/L (ref 3.5–5.2)
Sodium: 137 mmol/L (ref 134–144)
eGFR: 81 mL/min/{1.73_m2} (ref >59)

## 2021-09-25 LAB — CBC
Hematocrit: 47.5 % (ref 37.5–51.0)
Hemoglobin: 16.3 g/dL (ref 13.0–17.7)
MCH: 31.2 pg (ref 26.6–33.0)
MCHC: 34.3 g/dL (ref 31.5–35.7)
MCV: 91 fL (ref 79–97)
Platelets: 240 10*3/uL (ref 150–450)
RBC: 5.22 x10E6/uL (ref 4.14–5.80)
RDW: 13.2 % (ref 11.6–15.4)
WBC: 6.2 10*3/uL (ref 3.4–10.8)

## 2021-09-25 NOTE — Telephone Encounter (Addendum)
Cardiac Catheterization scheduled at [facility] for: Wednesday September 26, 2021 11:30 AM Arrival time and place: [facility] Main Entrance A at: 9:30 AM   Nothing to eat after midnight prior to procedure, clear liquids until 5 AM day of procedure.  Medication instructions: -Usual morning medications can be taken with sips of water including aspirin 81 mg.  Confirmed patient has responsible adult to drive home post procedure and be with patient first 24 hours after arriving home.  Patient reports no new symptoms concerning for COVID-19 in the past 10 days.  Patient was scheduled for BMP/CBC 09/24/21, but I do not see results.  Reviewed procedure instructions with patient, he forgot to get lab yesterday, will go to [facility] lab now, I will order BMP/CBC to be done STAT today since cath is tomorrow.

## 2021-09-26 ENCOUNTER — Other Ambulatory Visit: Payer: Self-pay

## 2021-09-26 ENCOUNTER — Ambulatory Visit (HOSPITAL_COMMUNITY)
Admission: RE | Admit: 2021-09-26 | Discharge: 2021-09-26 | Disposition: A | Discharge status: Home / Self Care | Payer: BC Managed Care – PPO | Attending: Cardiovascular Disease | Admitting: Cardiovascular Disease

## 2021-09-26 ENCOUNTER — Telehealth (HOSPITAL_COMMUNITY): Payer: Self-pay | Admitting: Pharmacy Technician

## 2021-09-26 ENCOUNTER — Other Ambulatory Visit (HOSPITAL_COMMUNITY): Payer: Self-pay

## 2021-09-26 ENCOUNTER — Encounter (HOSPITAL_COMMUNITY)
Admission: RE | Disposition: A | Discharge status: Home / Self Care | Payer: Self-pay | Source: Home / Self Care | Attending: Cardiovascular Disease

## 2021-09-26 DIAGNOSIS — Z79899 Other long term (current) drug therapy: Secondary | ICD-10-CM | POA: Diagnosis not present

## 2021-09-26 DIAGNOSIS — E785 Hyperlipidemia, unspecified: Secondary | ICD-10-CM

## 2021-09-26 DIAGNOSIS — I420 Dilated cardiomyopathy: Secondary | ICD-10-CM | POA: Insufficient documentation

## 2021-09-26 DIAGNOSIS — Z955 Presence of coronary angioplasty implant and graft: Secondary | ICD-10-CM

## 2021-09-26 DIAGNOSIS — Z87891 Personal history of nicotine dependence: Secondary | ICD-10-CM | POA: Insufficient documentation

## 2021-09-26 DIAGNOSIS — R9439 Abnormal result of other cardiovascular function study: Secondary | ICD-10-CM | POA: Diagnosis not present

## 2021-09-26 DIAGNOSIS — I2584 Coronary atherosclerosis due to calcified coronary lesion: Secondary | ICD-10-CM | POA: Diagnosis not present

## 2021-09-26 DIAGNOSIS — I2510 Atherosclerotic heart disease of native coronary artery without angina pectoris: Secondary | ICD-10-CM | POA: Diagnosis not present

## 2021-09-26 DIAGNOSIS — I10 Essential (primary) hypertension: Secondary | ICD-10-CM | POA: Diagnosis not present

## 2021-09-26 DIAGNOSIS — E782 Mixed hyperlipidemia: Secondary | ICD-10-CM | POA: Diagnosis not present

## 2021-09-26 DIAGNOSIS — R9431 Abnormal electrocardiogram [ECG] [EKG]: Secondary | ICD-10-CM

## 2021-09-26 DIAGNOSIS — Q221 Congenital pulmonary valve stenosis: Secondary | ICD-10-CM | POA: Diagnosis not present

## 2021-09-26 DIAGNOSIS — R931 Abnormal findings on diagnostic imaging of heart and coronary circulation: Secondary | ICD-10-CM | POA: Diagnosis not present

## 2021-09-26 HISTORY — PX: CORONARY STENT INTERVENTION: CATH118234

## 2021-09-26 HISTORY — PX: INTRAVASCULAR ULTRASOUND/IVUS: CATH118244

## 2021-09-26 HISTORY — PX: LEFT HEART CATH AND CORONARY ANGIOGRAPHY: CATH118249

## 2021-09-26 LAB — POCT ACTIVATED CLOTTING TIME
Activated Clotting Time: 233 seconds
Activated Clotting Time: 293 seconds
Activated Clotting Time: 251 seconds

## 2021-09-26 SURGERY — LEFT HEART CATH AND CORONARY ANGIOGRAPHY
Anesthesia: LOCAL

## 2021-09-26 MED ORDER — TICAGRELOR 90 MG PO TABS
90.0000 mg | ORAL_TABLET | Freq: Two times a day (BID) | ORAL | 11 refills | Status: DC
  Filled 2021-09-26: qty 60, 30d supply, fill #0

## 2021-09-26 MED ORDER — HYDRALAZINE HCL 20 MG/ML IJ SOLN: 10.0000 mg | INTRAMUSCULAR | Status: DC | PRN

## 2021-09-26 MED ORDER — MIDAZOLAM HCL 2 MG/2ML IJ SOLN
INTRAMUSCULAR | Status: AC
  Filled 2021-09-26: qty 2

## 2021-09-26 MED ORDER — SODIUM CHLORIDE 0.9 % WEIGHT BASED INFUSION
1.0000 mL/kg/h | INTRAVENOUS | Status: DC
  Administered 2021-09-26: 1 mL/kg/h via INTRAVENOUS

## 2021-09-26 MED ORDER — TICAGRELOR 90 MG PO TABS
ORAL_TABLET | ORAL | Status: AC
  Filled 2021-09-26: qty 1

## 2021-09-26 MED ORDER — SODIUM CHLORIDE 0.9% FLUSH: 3.0000 mL | INTRAVENOUS | Status: DC | PRN

## 2021-09-26 MED ORDER — SODIUM CHLORIDE 0.9 % WEIGHT BASED INFUSION
3.0000 mL/kg/h | INTRAVENOUS | Status: AC
Start: 2021-09-26 — End: 2021-09-26
  Administered 2021-09-26: 3 mL/kg/h via INTRAVENOUS

## 2021-09-26 MED ORDER — IOHEXOL 350 MG/ML SOLN
INTRAVENOUS | Status: DC | PRN
  Administered 2021-09-26: 180 mL

## 2021-09-26 MED ORDER — LIDOCAINE HCL (PF) 1 % IJ SOLN
INTRAMUSCULAR | Status: DC | PRN
  Administered 2021-09-26: 5 mL

## 2021-09-26 MED ORDER — LIDOCAINE HCL (PF) 1 % IJ SOLN
INTRAMUSCULAR | Status: AC
  Filled 2021-09-26: qty 30

## 2021-09-26 MED ORDER — LABETALOL HCL 5 MG/ML IV SOLN: 10.0000 mg | INTRAVENOUS | Status: DC | PRN

## 2021-09-26 MED ORDER — SODIUM CHLORIDE 0.9 % IV SOLN
250.0000 mL | INTRAVENOUS | Status: DC | PRN
Start: 2021-09-26 — End: 2021-09-26

## 2021-09-26 MED ORDER — ASPIRIN 81 MG PO CHEW: 81.0000 mg | CHEWABLE_TABLET | ORAL | Status: DC

## 2021-09-26 MED ORDER — HEPARIN (PORCINE) IN NACL 1000-0.9 UT/500ML-% IV SOLN
INTRAVENOUS | Status: AC
  Filled 2021-09-26: qty 500

## 2021-09-26 MED ORDER — TICAGRELOR 90 MG PO TABS: 90.0000 mg | ORAL_TABLET | Freq: Two times a day (BID) | ORAL | Status: DC

## 2021-09-26 MED ORDER — SODIUM CHLORIDE 0.9 % IV SOLN: 250.0000 mL | INTRAVENOUS | Status: DC | PRN

## 2021-09-26 MED ORDER — ONDANSETRON HCL 4 MG/2ML IJ SOLN: 4.0000 mg | Freq: Four times a day (QID) | INTRAMUSCULAR | Status: DC | PRN

## 2021-09-26 MED ORDER — FENTANYL CITRATE (PF) 100 MCG/2ML IJ SOLN
INTRAMUSCULAR | Status: DC | PRN
  Administered 2021-09-26 (×2): 25 ug via INTRAVENOUS

## 2021-09-26 MED ORDER — NITROGLYCERIN 0.4 MG SL SUBL
0.4000 mg | SUBLINGUAL_TABLET | SUBLINGUAL | 2 refills | Status: AC | PRN
  Filled 2021-09-26: qty 25, 7d supply, fill #0

## 2021-09-26 MED ORDER — TICAGRELOR 90 MG PO TABS
ORAL_TABLET | ORAL | Status: DC | PRN
  Administered 2021-09-26: 180 mg via ORAL

## 2021-09-26 MED ORDER — SODIUM CHLORIDE 0.9% FLUSH: 3.0000 mL | Freq: Two times a day (BID) | INTRAVENOUS | Status: DC

## 2021-09-26 MED ORDER — ACETAMINOPHEN 325 MG PO TABS: 650.0000 mg | ORAL_TABLET | ORAL | Status: DC | PRN

## 2021-09-26 MED ORDER — HEPARIN (PORCINE) IN NACL 1000-0.9 UT/500ML-% IV SOLN
INTRAVENOUS | Status: DC | PRN
  Administered 2021-09-26 (×2): 500 mL

## 2021-09-26 MED ORDER — NITROGLYCERIN 1 MG/10 ML FOR IR/CATH LAB
INTRA_ARTERIAL | Status: DC | PRN
  Administered 2021-09-26: 200 ug via INTRACORONARY
  Administered 2021-09-26: 100 ug via INTRACORONARY

## 2021-09-26 MED ORDER — ROSUVASTATIN CALCIUM 20 MG PO TABS
20.0000 mg | ORAL_TABLET | Freq: Every day | ORAL | 0 refills | Status: DC
  Filled 2021-09-26: qty 24, 24d supply, fill #0

## 2021-09-26 MED ORDER — VERAPAMIL HCL 2.5 MG/ML IV SOLN
INTRAVENOUS | Status: DC | PRN
  Administered 2021-09-26: 10 mL via INTRA_ARTERIAL

## 2021-09-26 MED ORDER — FENTANYL CITRATE (PF) 100 MCG/2ML IJ SOLN
INTRAMUSCULAR | Status: AC
  Filled 2021-09-26: qty 2

## 2021-09-26 MED ORDER — SODIUM CHLORIDE 0.9 % IV BOLUS
INTRAVENOUS | Status: DC | PRN
  Administered 2021-09-26: 250 mL via INTRAVENOUS

## 2021-09-26 MED ORDER — HEPARIN SODIUM (PORCINE) 1000 UNIT/ML IJ SOLN
INTRAMUSCULAR | Status: DC | PRN
  Administered 2021-09-26: 5000 [IU] via INTRAVENOUS
  Administered 2021-09-26: 3000 [IU] via INTRAVENOUS
  Administered 2021-09-26 (×2): 5000 [IU] via INTRAVENOUS

## 2021-09-26 MED ORDER — VERAPAMIL HCL 2.5 MG/ML IV SOLN
INTRAVENOUS | Status: AC
  Filled 2021-09-26: qty 2

## 2021-09-26 MED ORDER — SODIUM CHLORIDE 0.9 % WEIGHT BASED INFUSION: 1.0000 mL/kg/h | INTRAVENOUS | Status: DC

## 2021-09-26 MED ORDER — HEPARIN SODIUM (PORCINE) 1000 UNIT/ML IJ SOLN
INTRAMUSCULAR | Status: AC
  Filled 2021-09-26: qty 10

## 2021-09-26 MED ORDER — NITROGLYCERIN 1 MG/10 ML FOR IR/CATH LAB
INTRA_ARTERIAL | Status: AC
  Filled 2021-09-26: qty 10

## 2021-09-26 MED ORDER — MIDAZOLAM HCL 2 MG/2ML IJ SOLN
INTRAMUSCULAR | Status: DC | PRN
  Administered 2021-09-26 (×2): 2 mg via INTRAVENOUS

## 2021-09-26 MED ORDER — METOPROLOL SUCCINATE ER 25 MG PO TB24
25.0000 mg | ORAL_TABLET | Freq: Every day | ORAL | 0 refills | Status: DC
  Filled 2021-09-26: qty 30, 30d supply, fill #0

## 2021-09-26 MED ORDER — SODIUM CHLORIDE 0.9% FLUSH
3.0000 mL | INTRAVENOUS | Status: DC | PRN
Start: 2021-09-26 — End: 2021-09-26

## 2021-09-26 SURGICAL SUPPLY — 26 items
BALL SAPPHIRE NC24 3.75X15 (BALLOONS) ×2
BALLN SAPPHIRE 2.5X15 (BALLOONS) ×2
BALLN SCOREFLEX 3.0X15 (BALLOONS) ×2
BALLOON SAPPHIRE 2.5X15 (BALLOONS) IMPLANT
BALLOON SAPPHIRE NC24 3.75X15 (BALLOONS) IMPLANT
BALLOON SCOREFLEX 3.0X15 (BALLOONS) IMPLANT
BAND CMPR LRG ZPHR (HEMOSTASIS) ×1
BAND ZEPHYR COMPRESS 30 LONG (HEMOSTASIS) ×1 IMPLANT
CATH 5FR JL3.5 JR4 ANG PIG MP (CATHETERS) ×1 IMPLANT
CATH 5FR JL4 DIAGNOSTIC (CATHETERS) ×1 IMPLANT
CATH LAUNCHER 6FR EBU 4 (CATHETERS) ×1 IMPLANT
CATH OPTICROSS HD (CATHETERS) ×1 IMPLANT
CATH VISTA GUIDE 6FR XBLAD3.5 (CATHETERS) ×1 IMPLANT
ELECT DEFIB PAD ADLT CADENCE (PAD) ×1 IMPLANT
GLIDESHEATH SLEND SS 6F .021 (SHEATH) ×1 IMPLANT
GUIDEWIRE INQWIRE 1.5J.035X260 (WIRE) IMPLANT
INQWIRE 1.5J .035X260CM (WIRE) ×2
KIT ENCORE 26 ADVANTAGE (KITS) ×1 IMPLANT
KIT HEART LEFT (KITS) ×2 IMPLANT
PACK CARDIAC CATHETERIZATION (CUSTOM PROCEDURE TRAY) ×2 IMPLANT
SLED PULL BACK IVUS (MISCELLANEOUS) ×1 IMPLANT
STENT ONYX FRONTIER 3.5X34 (Permanent Stent) ×1 IMPLANT
TRANSDUCER W/STOPCOCK (MISCELLANEOUS) ×2 IMPLANT
TUBING CIL FLEX 10 FLL-RA (TUBING) ×2 IMPLANT
VALVE GUARDIAN II ~~LOC~~ HEMO (MISCELLANEOUS) ×1 IMPLANT
WIRE COUGAR XT STRL 190CM (WIRE) ×1 IMPLANT

## 2021-09-26 NOTE — Discharge Summary (Signed)
Discharge Summary for Same Day PCI   Patient ID: Brandon Kemp MRN: [id number]; DOB: 1975-11-07  Admit date: 09/26/2021 Discharge date: 09/26/2021  Primary Care Provider: Patient, No Pcp Per  Primary Cardiologist: Sherren Mocha, MD  Primary Electrophysiologist:  None   Discharge Diagnoses    Principal Problem:   Abnormal nuclear stress test Active Problems:   Hypertension   Hyperlipidemia  Diagnostic Studies/Procedures    Cardiac Catheterization 09/26/2021:  1.  Severe single-vessel coronary artery disease with subtotal stenosis of the proximal LAD, successfully treated with PCI using IVUS guidance (3.5 x 34 mm Onyx frontier DES) 2.  Patent left main, left circumflex, and dominant RCA with mild diffuse plaquing but no significant stenoses   Recommend: Same-day PCI discharge protocol if criteria met.  Dual antiplatelet therapy with aspirin and ticagrelor x12 months  Diagnostic Dominance: Right  Intervention   _____________   History of Present Illness     Brandon Kemp is a 46 y.o. male with past medical history of hypertension and clinical pneumonic stenosis with surgical repair at age 41.  He is followed by Dr. Burt Knack as an outpatient.  Echo 2013 showed normal LVEF, mild LVH, moderate pulmonic regurgitation without significant pulmonic stenosis.  Prior stress testing showed good exercise tolerance without significant ST/T wave abnormalities.  He wore Holter monitor showing rare PVCs in the setting of palpitations.  Had a renal artery duplex in 2013 which was normal with no evidence of renal artery stenosis.  Underwent cardiac cath in 2016 showing mild nonobstructive CAD.  He was recently seen by Dr. Burt Knack in May and noted his blood pressures were poorly controlled.  He had stopped all of his medications.  He was started on Micardis 40 mg daily with coronary calcium scan ordered showing a calcium score of 867.  Echocardiogram showed mildly reduced LV function at 45 to 50%  with global hypokinesis and no regional wall motion abnormality.  Stress test done which showed ischemia in the LAD territory.  He was set up for outpatient cardiac catheterization.   Hospital Course     The patient underwent cardiac cath as noted above with severe single-vessel CAD with subtotal stenosis of proximal RCA treated with PCI/IVUS guidance and DES x1. Plan for DAPT with ASA/Brilinta for at least 1 year. The patient was seen by cardiac rehab while in short stay. There were no observed complications post cath. Radial cath site was re-evaluated prior to discharge and found to be stable without any complications. Instructions/precautions regarding cath site care were given prior to discharge.  Brandon Kemp was seen by Dr. Burt Knack and determined stable for discharge home. Follow up with our office has been arranged. Medications are listed below. Pertinent changes include addition of aspirin, Brilinta, increased Crestor to 20 mg daily, added Toprol XL 68m daily.  _____________  Cath/PCI Registry Performance & Quality Measures: Aspirin prescribed? - Yes ADP Receptor Inhibitor (Plavix/Clopidogrel, Brilinta/Ticagrelor or Effient/Prasugrel) prescribed (includes medically managed patients)? - Yes High Intensity Statin (Lipitor 40-823mor Crestor 20-4072mprescribed? - Yes For EF <40%, was ACEI/ARB prescribed? - Not Applicable (EF >/= 40%70%or EF <40%, Aldosterone Antagonist (Spironolactone or Eplerenone) prescribed? - Not Applicable (EF >/= 40%26%ardiac Rehab Phase Kemp ordered (Included Medically managed Patients)? - Yes  _____________   Discharge Vitals Blood pressure 139/85, pulse 77, temperature 97.8 F (36.6 C), temperature source Temporal, resp. rate (!) 9, height 5' 11"  (1.803 m), weight 107 kg, SpO2 96 %.  Filed Weights  09/26/21 1000  Weight: 107 kg    Last Labs & Radiologic Studies    CBC Recent Labs    09/25/21 1231  WBC 6.2  HGB 16.3  HCT 47.5  MCV 91  PLT 597    Basic Metabolic Panel Recent Labs    09/25/21 1231  NA 137  K 5.0  CL 103  CO2 26  GLUCOSE 146*  BUN 12  CREATININE 1.13  CALCIUM 9.6   Liver Function Tests No results for input(s): "AST", "ALT", "ALKPHOS", "BILITOT", "PROT", "ALBUMIN" in the last 72 hours. No results for input(s): "LIPASE", "AMYLASE" in the last 72 hours. High Sensitivity Troponin:   No results for input(s): "TROPONINIHS" in the last 720 hours.  BNP Invalid input(s): "POCBNP" D-Dimer No results for input(s): "DDIMER" in the last 72 hours. Hemoglobin A1C No results for input(s): "HGBA1C" in the last 72 hours. Fasting Lipid Panel No results for input(s): "CHOL", "HDL", "LDLCALC", "TRIG", "CHOLHDL", "LDLDIRECT" in the last 72 hours. Thyroid Function Tests No results for input(s): "TSH", "T4TOTAL", "T3FREE", "THYROIDAB" in the last 72 hours.  Invalid input(s): "FREET3" _____________  CARDIAC CATHETERIZATION  Result Date: 09/26/2021 1.  Severe single-vessel coronary artery disease with subtotal stenosis of the proximal LAD, successfully treated with PCI using IVUS guidance (3.5 x 34 mm Onyx frontier DES) 2.  Patent left main, left circumflex, and dominant RCA with mild diffuse plaquing but no significant stenoses Recommend: Same-day PCI discharge protocol if criteria met.  Dual antiplatelet therapy with aspirin and ticagrelor x12 months   MYOCARDIAL PERFUSION IMAGING  Result Date: 09/14/2021   Findings are consistent with ischemia. The study is intermediate risk.   Nuclear stress EF: 52 %. The left ventricular ejection fraction is mildly decreased (45-54%). End diastolic cavity size is normal.   Prior study not available for comparison. Small size, moderate intensity; reversible inducible apical lesion concerning for apical ischemia   ECHOCARDIOGRAM COMPLETE  Result Date: 08/30/2021    ECHOCARDIOGRAM REPORT   [name] Date of Exam: 08/30/2021 Medical Rec #:  [id number]         Height:        71.0 in Accession #:    [id number]        Weight:       238.6 lb Date of Birth:  1976/01/04         BSA:          2.273 m Patient Age:    28 years          BP:           130/100 mmHg Patient Gender: M                 HR:           70 bpm. 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 Risk Factors:Hypertension.  Sonographer:    Philipp Deputy RDCS Referring Phys: Ethel  1. Left ventricular ejection fraction, by estimation, is 45 to 50%. The left ventricle has mildly decreased function. The left ventricle has no regional wall motion abnormalities. Left ventricular diastolic parameters were normal. The average left ventricular global longitudinal strain is -13.2 %. The global longitudinal strain is abnormal.  2. Right ventricular systolic function is normal. The right ventricular size is mildly enlarged. There is normal pulmonary artery systolic pressure.  3. The mitral valve is normal in structure. Trivial mitral valve regurgitation.  4. The aortic valve is normal in structure. Aortic valve regurgitation is not visualized.  5. The pulmonic valve was abnormal. Mild pulmonic stenosis.  6. The inferior vena cava is normal in size with greater than 50% respiratory variability, suggesting right atrial pressure of 3 mmHg. FINDINGS  Left Ventricle: Left ventricular ejection fraction, by estimation, is 45 to 50%. The left ventricle has mildly decreased function. The left ventricle has no regional wall motion abnormalities. The average left  ventricular global longitudinal strain is -13.2 %. The global longitudinal strain is abnormal. The left ventricular internal cavity size was normal in size. There is no left ventricular hypertrophy. Left ventricular diastolic parameters were normal. The ratio of pulmonic flow to systemic flow (Qp/Qs ratio) is 1.40. Right Ventricle: The right ventricular size is mildly enlarged. Right vetricular wall thickness was not well visualized. Right ventricular systolic function is normal. There is normal pulmonary artery systolic pressure. The tricuspid regurgitant velocity  is 1.83 m/s, and with an assumed right atrial pressure of 3 mmHg, the estimated right ventricular systolic pressure is 41.7 mmHg. Left Atrium: Left atrial size was normal in size. Right Atrium: Right atrial size was normal in size. Pericardium: There is no evidence of pericardial effusion. Mitral Valve: The mitral valve is normal in structure. Trivial mitral valve regurgitation. Tricuspid Valve: The tricuspid valve is normal in structure. Tricuspid valve regurgitation is trivial. Aortic Valve: The aortic valve is normal in structure. Aortic valve regurgitation is not visualized. Pulmonic Valve: The pulmonic valve was abnormal. Pulmonic valve regurgitation is mild to moderate. Mild pulmonic stenosis. Aorta: The aortic root and ascending aorta are structurally normal, with no evidence of dilitation. Venous: The inferior vena cava is normal in size with greater than 50% respiratory variability, suggesting right atrial pressure of 3 mmHg. IAS/Shunts: The interatrial septum was not well visualized. The ratio of pulmonic flow to systemic flow (Qp/Qs ratio) is 1.40.  LEFT VENTRICLE PLAX 2D LVIDd:         3.80 cm   Diastology LVIDs:         3.00 cm   LV e' medial:    7.72 cm/s LV PW:         1.00 cm   LV E/e' medial:  9.2 LV IVS:        1.00 cm   LV e' lateral:   9.46 cm/s LVOT diam:     2.00 cm   LV E/e' lateral: 7.5 LV SV:         68 LV SV Index:   30         2D Longitudinal Strain LVOT Area:     3.14 cm  2D Strain GLS Avg:     -13.2 %  RIGHT VENTRICLE            IVC RV Basal diam:  3.70 cm    IVC diam: 1.70 cm RV  Mid diam:    3.40 cm RV S prime:     9.79 cm/s RVOT diam:      2.60 cm TAPSE (M-mode): 2.0 cm LEFT ATRIUM             Index        RIGHT ATRIUM           Index LA diam:        3.90 cm 1.72 cm/m   RA Area:     15.70 cm LA Vol (A2C):   55.4 ml 24.38 ml/m  RA Volume:   41.10 ml  18.08 ml/m LA Vol (A4C):   31.0 ml 13.64 ml/m LA Biplane Vol: 42.2 ml 18.57 ml/m  AORTIC VALVE             PULMONIC VALVE LVOT Vmax:   99.80 cm/s  PV Area (Vmax):  2.97 cm LVOT Vmean:  76.200 cm/s PV Area (Vmean): 3.07 cm LVOT VTI:    0.215 m     PV Area (VTI):   2.97 cm                          PV Vmax:         1.41 m/s AORTA                    PV Vmean:        100.000 cm/s Ao Root diam: 3.30 cm    PV VTI:          0.320 m Ao Asc diam:  3.00 cm    PV Peak grad:    8.0 mmHg Ao Desc diam: 2.10 cm    PV Mean grad:    5.0 mmHg                          RVOT Peak grad:  2 mmHg  MITRAL VALVE               TRICUSPID VALVE MV Area (PHT): 3.60 cm    TR Peak grad:   13.4 mmHg MV Decel Time: 211 msec    TR Vmax:        183.00 cm/s MV E velocity: 71.30 cm/s MV A velocity: 44.50 cm/s  SHUNTS MV E/A ratio:  1.60        Systemic VTI:  0.22 m                            Systemic Diam: 2.00 cm                            Pulmonic VTI:  0.179 m                            Pulmonic Diam: 2.60 cm                            Qp/Qs:         1.41 Mertie Moores MD Electronically signed by Mertie Moores MD Signature Date/Time: 08/30/2021/11:48:53 AM    Final    CT CARDIAC SCORING (SELF PAY ONLY)  Addendum Date: 08/30/2021   ADDENDUM REPORT: 08/30/2021 10:24 EXAM: OVER-READ INTERPRETATION  CT CHEST The following report is an over-read performed by radiologist Dr. Jason Nest  [facility], [place] on 08/30/2021. This over-read does not include interpretation of cardiac or coronary anatomy or pathology.  The coronary calcium score interpretation by the cardiologist is attached. COMPARISON:  None. FINDINGS: Vascular: No significant extracardiac vascular findings. Unchanged calcification of the pericardium overlying the right ventricular outflow tract. Mediastinum/Nodes: No lymphadenopathy. Lungs/Pleura: No focal airspace disease. No suspicious pulmonary nodules. Upper Abdomen: No acute abnormality. Musculoskeletal: No acute osseous abnormality. No suspicious osseous lesion. Prior median sternotomy. IMPRESSION: No acute or significant incidental extracardiac findings in the chest. Electronically Signed   By: Maurine Simmering M.D.   On: 08/30/2021 10:24   Result Date: 08/30/2021 CLINICAL DATA:  Cardiovascular Disease Risk stratification EXAM: Coronary Calcium Score TECHNIQUE: A gated, non-contrast computed tomography scan of the heart was performed using 47m slice thickness. Axial images were analyzed on a dedicated workstation. Calcium scoring of the coronary arteries was performed using the Agatston method. FINDINGS: Coronary Calcium Score: Left main: 83.8 Left anterior descending artery: 585 Left circumflex artery: 48.9 Right coronary artery: 149 Total: 867 Percentile: 99 Pericardium: Normal. Ascending Aorta: Normal caliber. [facility]cardiac: [facility] IMPRESSION: Coronary calcium score of 867. This was 957percentile for age-, race-, and sex-matched controls. RECOMMENDATIONS: Coronary artery calcium (CAC) score is a strong predictor of incident coronary heart disease (CHD) and provides predictive information beyond traditional risk factors. CAC scoring is reasonable to use in the decision to withhold, postpone, or initiate statin therapy in intermediate-risk or selected borderline-risk asymptomatic adults (age 46-75years and LDL-C >=70 to <190 mg/dL) who do not have diabetes or established atherosclerotic cardiovascular disease (ASCVD).* In intermediate-risk (10-year ASCVD risk >=7.5%  to <20%) adults or selected borderline-risk (10-year ASCVD risk >=5% to <7.5%) adults in whom a CAC score is measured for the purpose of making a treatment decision the following recommendations have been made: If CAC=0, it is reasonable to withhold statin therapy and reassess in 5 to 10 years, as long as higher risk conditions are absent (diabetes mellitus, family history of premature CHD in first degree relatives (males <55 years; females <65 years), cigarette smoking, or LDL >=190 mg/dL). If CAC is 1 to 99, it is reasonable to initiate statin therapy for patients >=584years of age. If CAC is >=100 or >=75th percentile, it is reasonable to initiate statin therapy at any age. Cardiology referral should be considered for patients with CAC scores >=400 or >=75th percentile. *2018 AHA/ACC/AACVPR/AAPA/ABC/ACPM/ADA/AGS/APhA/ASPC/NLA/PCNA Guideline on the Management of Blood Cholesterol: A Report of the American College of Cardiology/American Heart Association Task Force on Clinical Practice Guidelines. J Am Coll Cardiol. 2019;73(24):3168-3209. BKirk Ruths MD Electronically Signed: By: BKirk RuthsM.D. On: 08/30/2021 10:09    Disposition   Pt is being discharged home today in good condition.  Follow-up Plans & Appointments     Follow-up Information     DCharlie Pitter PA-C Follow up on 10/05/2021.   Specialties: Cardiology, Radiology Why: at 10am for your follow up appt with Dr. [place]ork Cerise [place] [place] [place] [phone]               Discharge Instructions     Amb Referral to Cardiac Rehabilitation   Complete by: As directed    Diagnosis: Coronary Stents   After initial evaluation and assessments completed: Virtual Based Care may be provided alone or in conjunction with Phase 2 Cardiac Rehab based on patient barriers.: Yes        Discharge Medications  Allergies as of 09/26/2021   No Known Allergies       Medication List     TAKE these medications    aspirin EC 81 MG tablet Take 81 mg by mouth daily. Swallow whole.   Coenzyme Q10 100 MG Tabs Take 2 tablets (200 mg total) by mouth daily. What changed:  how much to take when to take this   Magnesium 500 MG Tabs Take 500 mg by mouth daily.   methylphenidate 10 MG 24 hr capsule Commonly known as: RITALIN LA Take 10 mg by mouth daily.   metoprolol succinate 25 MG 24 hr tablet Commonly known as: Toprol XL Take 1 tablet (25 mg total) by mouth daily.   nitroGLYCERIN 0.4 MG SL tablet Commonly known as: Nitrostat Place 1 tablet (0.4 mg total) under the tongue every 5 (five) minutes as needed. Notes to patient: Please do not use within 36hrs of sildenafil!!!   rosuvastatin 20 MG tablet Commonly known as: CRESTOR Take 1 tablet (20 mg total) by mouth daily. What changed:  medication strength how much to take   sildenafil 20 MG tablet Commonly known as: REVATIO Take 20 mg by mouth daily as needed (ED).   telmisartan 40 MG tablet Commonly known as: Micardis Take 1 tablet (40 mg total) by mouth daily.   ticagrelor 90 MG Tabs tablet Commonly known as: BRILINTA Take 1 tablet (90 mg total) by mouth 2 (two) times daily.           Allergies No Known Allergies  Outstanding Labs/Studies   FLP/LFTs   Duration of Discharge Encounter   Greater than 30 minutes including physician time.  Signed, Reino Bellis, NP 09/26/2021, 3:44 PM

## 2021-09-26 NOTE — Interval H&P Note (Signed)
History and Physical Interval Note:  09/26/2021 10:55 AM  Brandon Kemp  has presented today for surgery, with the diagnosis of abnormal stress test.  The various methods of treatment have been discussed with the patient and family. After consideration of risks, benefits and other options for treatment, the patient has consented to  Procedure(s): LEFT HEART CATH AND CORONARY ANGIOGRAPHY (N/A) as a surgical intervention.  The patient's history has been reviewed, patient examined, no change in status, stable for surgery.  I have reviewed the patient's chart and labs.  Questions were answered to the patient's satisfaction.     [name]

## 2021-09-26 NOTE — Progress Notes (Addendum)
Pt disconnected IVF from saline lock, removed telemetry leads, BP cuff and is getting dressed-states he is "getting stir crazy"-d/c is ordered for 1900 and pt aware-pt's mother at [facility]

## 2021-09-26 NOTE — Telephone Encounter (Signed)
Pharmacy Patient Advocate Encounter  Insurance verification completed.    The patient is insured through [facility] [employer]   The patient is currently admitted and ran test claims for the following: Brilinta.  Copays and coinsurance results were relayed to Inpatient clinical team.

## 2021-09-26 NOTE — Progress Notes (Signed)
Seen pt from [id number] pt was educated on stent procedure, restrictions, Aspirin & Birlinta med use, ex guidelines, NTG use, heart healthy diet, and CRPII. Pt is being referred to CRPII at [facility].    Brandon Kemp 09/26/2021 2:15 PM

## 2021-09-26 NOTE — Discharge Instructions (Addendum)
Drink plenty of fluid for 48 hours and keep wrist elevated at heart level for 24 hours  Radial Site Care   This sheet gives you information about how to care for yourself after your procedure. Your health care provider may also give you more specific instructions. If you have problems or questions, contact your health care provider. What can I expect after the procedure? After the procedure, it is common to have: Bruising and tenderness at the catheter insertion area. Follow these instructions at home: Medicines Take over-the-counter and prescription medicines only as told by your health care provider. Insertion site care Follow instructions from your health care provider about how to take care of your insertion site. Make sure you: Wash your hands with soap and water before you change your bandage (dressing). If soap and water are not available, use hand sanitizer. remove your dressing as told by your health care provider. In 24 hours Check your insertion site every day for signs of infection. Check for: Redness, swelling, or pain. Fluid or blood. Pus or a bad smell. Warmth. Do not take baths, swim, or use a hot tub until your health care provider approves. You may shower 24-48 hours after the procedure, or as directed by your health care provider. Remove the dressing and gently wash the site with plain soap and water. Pat the area dry with a clean towel. Do not rub the site. That could cause bleeding. Do not apply powder or lotion to the site. Activity   For 24 hours after the procedure, or as directed by your health care provider: Do not flex or bend the affected arm. Do not push or pull heavy objects with the affected arm. Do not drive yourself home from the hospital or clinic. You may drive 24 hours after the procedure unless your health care provider tells you not to. Do not operate machinery or power tools. Do not lift anything that is heavier than 10 lb (4.5 kg), or the  limit that you are told, until your health care provider says that it is safe. For 4 days Ask your health care provider when it is okay to: Return to work or school. Resume usual physical activities or sports. Resume sexual activity. General instructions If the catheter site starts to bleed, raise your arm and put firm pressure on the site. If the bleeding does not stop, get help right away. This is a medical emergency. If you went home on the same day as your procedure, a responsible adult should be with you for the first 24 hours after you arrive home. Keep all follow-up visits as told by your health care provider. This is important. Contact a health care provider if: You have a fever. You have redness, swelling, or yellow drainage around your insertion site. Get help right away if: You have unusual pain at the radial site. The catheter insertion area swells very fast. The insertion area is bleeding, and the bleeding does not stop when you hold steady pressure on the area. Your arm or hand becomes pale, cool, tingly, or numb. These symptoms may represent a serious problem that is an emergency. Do not wait to see if the symptoms will go away. Get medical help right away. Call your local emergency services (911 in the [place] Do not drive yourself to the hospital. Summary After the procedure, it is common to have bruising and tenderness at the site. Follow instructions from your health care provider about how to take care of your radial   site wound. Check the wound every day for signs of infection. Do not lift anything that is heavier than 10 lb (4.5 kg), or the limit that you are told, until your health care provider says that it is safe. This information is not intended to replace advice given to you by your health care provider. Make sure you discuss any questions you have with your health care provider. Document Revised: 03/26/2017 Document Reviewed: 03/26/2017 Elsevier Patient Education   2020 Elsevier Inc.    Information about your medication: Brilinta (anti-platelet agent)  Generic Name (Brand): ticagrelor (Brilinta), twice daily medication  PURPOSE: You are taking this medication along with aspirin to lower your chance of having a heart attack, stroke, or blood clots in your heart stent. These can be fatal. Brilinta and aspirin help prevent platelets from sticking together and forming a clot that can block an artery or your stent.   Common SIDE EFFECTS you may experience include: bruising or bleeding more easily, shortness of breath  Do not stop taking BRILINTA without talking to the doctor who prescribes it for you. People who are treated with a stent and stop taking Brilinta too soon, have a higher risk of getting a blood clot in the stent, having a heart attack, or dying. If you stop Brilinta because of bleeding, or for other reasons, your risk of a heart attack or stroke may increase.   Avoid taking NSAID agents or anti-inflammatory medications such as ibuprofen, naproxen given increased bleed risk with plavix - can use acetaminophen (Tylenol) if needed for pain.  Tell all of your doctors and dentists that you are taking Brilinta. They should talk to the doctor who prescribed Brilinta for you before you have any surgery or invasive procedure.   Contact your health care provider if you experience: severe or uncontrollable bleeding, pink/red/brown urine, vomiting blood or vomit that looks like "coffee grounds", red or black stools (looks like tar), coughing up blood or blood clots ----------------------------------------------------------------------------------------------------------------------  

## 2021-09-26 NOTE — TOC Benefit Eligibility Note (Signed)
Patient Product/process development scientist completed.    The patient is currently admitted and upon discharge could be taking Brilinta 90 mg.  The current 30 day co-pay is $0.00.   The patient is insured through [employer] [employer]   Roland Earl, CPhT Pharmacy Patient Advocate Specialist [facility] Patient Advocate Team Direct Number: [phone]  Fax: [phone]

## 2021-09-27 ENCOUNTER — Encounter (HOSPITAL_COMMUNITY): Payer: Self-pay | Admitting: Cardiovascular Disease

## 2021-10-02 ENCOUNTER — Encounter: Payer: Self-pay | Admitting: Physician Assistant

## 2021-10-02 NOTE — Progress Notes (Signed)
Cardiology Office Note    Date:  10/05/2021   ID:  Brandon Kemp, DOB 1975/06/28, MRN [id number]  PCP:  Patient, No Pcp Per  Cardiologist:  Sherren Mocha, MD  Electrophysiologist:  None   Chief Complaint: f/u PCI  History of Present Illness:   Brandon Kemp is a 46 y.o. 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 He was recently seen by Dr. Burt Knack in 07/2021 after a hiatus from follow-up and had been off of all of his medicines. Antihypertensives and statin were restarted. He underwent repeat echo showing EF 45-50% (down from previously normal), no RWMA, mildly enlarged RV, mild pulmonic stenosis. CAC was markedly elevated prompting nuclear stress test which was abnormal which subsequently prompted cardiac cath. This was performed 09/26/21 with subtotal stenosis of the prox LAD s/p DES with otherwise patent left main, left circumflex, and dominant RCA with mild diffuse plaquing but no significant stenoses. He was able to be discharged on the same day PCI protocol with ASA/Brilinta, increase in rosuvastatin and addition of Toprol. 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  He is seen for follow-up today doing well. Looking back retrospectively he now recognizes a decline in endurance that was occurring pre-PCI that has improved after intervention. He does note some generalized fatigue but otherwise is pleased with how he is feeling. No CP, SOB, edema, or palpitations. He quit smoking.   Labwork independently reviewed: 09/2021 K 5, Cr 1.13, CBC wnl, glu 147 2020 LDL 189, TSH wnl, LFTs ok  Cardiology Studies:   Studies reviewed are outlined and summarized above. Reports included below if pertinent.   Cath 09/26/21 1.   Severe single-vessel coronary artery disease with subtotal stenosis of the proximal LAD, successfully treated with PCI using IVUS guidance (3.5 x 34 mm Onyx frontier DES) 2.  Patent left main, left circumflex, and dominant RCA with mild diffuse plaquing but no significant stenoses   Recommend: Same-day PCI discharge protocol if criteria met.  Dual antiplatelet therapy with aspirin and ticagrelor x12 months  2D echo 08/30/21    1. Left ventricular ejection fraction, by estimation, is 45 to 50%. The  left ventricle has mildly decreased function. The left ventricle has no  regional wall motion abnormalities. Left ventricular diastolic parameters  were normal. The average left  ventricular global longitudinal strain is -13.2 %. The global longitudinal  strain is abnormal.   2. Right ventricular systolic function is normal. The right ventricular  size is mildly enlarged. There is normal pulmonary artery systolic  pressure.   3. The mitral valve is normal in structure. Trivial mitral valve  regurgitation.   4. The aortic valve is normal in structure. Aortic valve regurgitation is  not visualized.   5. The pulmonic valve was abnormal. Mild pulmonic stenosis.   6. The inferior vena cava is normal in size with greater than 50%  respiratory variability, suggesting right atrial pressure of 3 mmHg.   2013 Renal duplex normal    Past Medical History:  Diagnosis Date   CAD (coronary artery disease)    Congenital pulmonic valve stenosis    a. s/p surgical repair @ Age 35 in [place];  b.  12/12  Echo: EF 55-60%, nl wall motion, mild LVH, moderate PR.  History of tobacco abuse    Hypertension    Ischemic cardiomyopathy    Relationship problem between partners 12/07/2017   Symptomatic PVCs    a. 09/2011    Past Surgical History:  Procedure Laterality Date   ANKLE SURGERY     CORONARY STENT INTERVENTION N/A 09/26/2021   Procedure: CORONARY STENT INTERVENTION;  Surgeon: Sherren Mocha, MD;   Location: [facility];  Service: Cardiovascular;  Laterality: N/A;   HAND SURGERY     INTRAVASCULAR ULTRASOUND/IVUS N/A 09/26/2021   Procedure: Intravascular Ultrasound/IVUS;  Surgeon: Sherren Mocha, MD;  Location: [facility];  Service: Cardiovascular;  Laterality: N/A;   LEFT HEART CATH AND CORONARY ANGIOGRAPHY N/A 09/26/2021   Procedure: LEFT HEART CATH AND CORONARY ANGIOGRAPHY;  Surgeon: Sherren Mocha, MD;  Location: [facility];  Service: Cardiovascular;  Laterality: N/A;   pulmonary outflow patch  age 6   pulmonary valvotomy  age 6    Current Medications: Current Meds  Medication Sig   aspirin EC 81 MG tablet Take 81 mg by mouth daily. Swallow whole.   Coenzyme Q10 100 MG TABS Take 2 tablets (200 mg total) by mouth daily.   Magnesium 500 MG TABS Take 500 mg by mouth daily.   methylphenidate (RITALIN LA) 10 MG 24 hr capsule Take 10 mg by mouth daily.    metoprolol succinate (TOPROL XL) 25 MG 24 hr tablet Take 1 tablet (25 mg total) by mouth daily.   nitroGLYCERIN (NITROSTAT) 0.4 MG SL tablet Place 1 tablet (0.4 mg total) under the tongue every 5 (five) minutes as needed.   rosuvastatin (CRESTOR) 20 MG tablet Take 1 tablet (20 mg total) by mouth daily.   sildenafil (REVATIO) 20 MG tablet Take 20 mg by mouth daily as needed (ED).   telmisartan (MICARDIS) 40 MG tablet Take 1 tablet (40 mg total) by mouth daily.   ticagrelor (BRILINTA) 90 MG TABS tablet Take 1 tablet (90 mg total) by mouth 2 (two) times daily.      Allergies:   Patient has no known allergies.   Social History   Socioeconomic History   Marital status: Married    Spouse name: Not on file   Number of children: Not on file   Years of education: Not on file   Highest education level: Bachelor's degree (e.g., BA, AB, BS)  Occupational History   Not on file  Tobacco Use   Smoking status: Former   Smokeless tobacco: Never   Tobacco comments:    sometimes, when stressed about 2 cigarettes a  day  Substance and Sexual Activity   Alcohol use: No    Comment: social   Drug use: No   Sexual activity: Not Currently    Birth control/protection: Abstinence  Other Topics Concern   Not on file  Social History Narrative   Not on file   Social Determinants of Health   Financial Resource Strain: Not on file  Food Insecurity: Not on file  Transportation Needs: Not on file  Physical Activity: Not on file  Stress: Not on file  Social Connections: Not on file     Family History:  The patient's family history includes Hypertension in his father; Yves Dill Parkinson White syndrome in his brother.  ROS:   Please see the history of present illness. All other systems are reviewed and otherwise negative.    EKG(s)/Additional Labs   EKG:  EKG is ordered today, personally reviewed, demonstrating NSR 74bom, possible LAE, ST upsloping in leads  III, avF, V3, with TWI I, avL, V2, V5-V6 and biphasic appearing TW in V4. EKG was repeated to confirm lead placement, appeared similar. I reviewed with Dr. Lovena Le (DOD) given appearance of STTW in V3- he feels this reflects J point elevation that was seen in 07/2021 tracing (similarly in leads III, avF) with otherwise similar nonspecific STTW changes at that time as well. He feels the EKG is reassuring without any acute concerns.  Recent Labs: 09/25/2021: BUN 12; Creatinine, Ser 1.13; Hemoglobin 16.3; Platelets 240; Potassium 5.0; Sodium 137  Recent Lipid Panel    Component Value Date/Time   CHOL 265 (H) 02/17/2019 1143   TRIG 166 (H) 02/17/2019 1143   HDL 45 02/17/2019 1143   CHOLHDL 5.9 (H) 02/17/2019 1143   CHOLHDL 5 09/09/2014 0902   VLDL 23.6 09/09/2014 0902   LDLCALC 189 (H) 02/17/2019 1143   LDLDIRECT 155.7 09/26/2011 1031    PHYSICAL EXAM:    VS:  BP 120/70   Pulse 82   Ht 5' 11" (1.803 m)   Wt 238 lb 12.8 oz (108.3 kg)   SpO2 98%   BMI 33.31 kg/m   BMI: Body mass index is 33.31 kg/m.  GEN: Well nourished, well developed male in  no acute distress HEENT: normocephalic, atraumatic Neck: no JVD, carotid bruits, or masses Cardiac: RRR; no murmurs, rubs, or gallops, no edema  Respiratory:  clear to auscultation bilaterally, normal work of breathing GI: soft, nontender, nondistended, + BS MS: no deformity or atrophy Skin: warm and dry, no rash. Right radial cath site with mild resolving ecchymosis, good pulse, no bruit. Neuro:  Alert and Oriented x 3, Strength and sensation are intact, follows commands Psych: euthymic mood, full affect  Wt Readings from Last 3 Encounters:  10/05/21 238 lb 12.8 oz (108.3 kg)  09/26/21 236 lb (107 kg)  09/21/21 239 lb (108.4 kg)     ASSESSMENT & PLAN:    1. CAD, HLD - doing well post-PCI. Continue ASA, Brilinta, metoprolol, rosuvastatin. No cost concerns with Brilinta. EKG reviewed with DOD as outlined above. Will plan to recheck fasting CMET/lipid profile in 6 weeks to recheck lipid profile. He does note some generalized fatigue. This may be due to the metoprolol. We will see how it goes over the next few weeks and if it becomes pervasive, can consider decreasing the dose. Otherwise recommended he discuss his new cardiac hx with the prescriber of his methylphenidate so they can make informed decision of whether to continue. Also relayed instructions for avoidance of sildenafil within 48hr of taking SL NTG and vice versa. OK to refill CoQ-10 today.  2. Ischemic cardiomyopathy - Dr. Radford Pax (who saw patient pre-cath) felt etiology of cardiomyopathy was likely ischemic in nature. He appears euvolemic. On ARB, BB. NYHA class 1 presently. Consider repeating echocardiogram after next follow-up visit.  3. Pulmonic stenosis s/p repair with mild PR - continue to follow clinically. Anticipate periodic echocardiograms in the future but as of now he is up to date.  4. Essential HTN - controlled, no changes made today.  5. H/o PVCs - quiescent.  6. Hyperglycemia - check A1C with labs today. Patient  plans to obtain PCP.    Cardiac Rehabilitation Eligibility Assessment  The patient is ready to start cardiac rehabilitation from a cardiac standpoint.   However, he feels he likely will defer participation in the program as he was already exercising at a higher level than he anticipates the program would offer.  Disposition: F/u with 3-4 months  with Dr. Burt Knack or myself if no availability.   Medication Adjustments/Labs and Tests Ordered: Current medicines are reviewed at length with the patient today.  Concerns regarding medicines are outlined above. Medication changes, Labs and Tests ordered today are summarized above and listed in the Patient Instructions accessible in Encounters.   Signed, Charlie Pitter, PA-C  10/05/2021 11:28 AM    [place] Phone: [phone]; Fax: [phone]

## 2021-10-04 ENCOUNTER — Ambulatory Visit: Payer: 59 | Admitting: Psychiatry

## 2021-10-05 ENCOUNTER — Ambulatory Visit: Payer: BC Managed Care – PPO | Admitting: Physician Assistant

## 2021-10-05 ENCOUNTER — Encounter: Payer: Self-pay | Admitting: Physician Assistant

## 2021-10-05 VITALS — BP 120/70 | HR 82 | Ht 71.0 in | Wt 238.8 lb

## 2021-10-05 DIAGNOSIS — I2510 Atherosclerotic heart disease of native coronary artery without angina pectoris: Secondary | ICD-10-CM

## 2021-10-05 DIAGNOSIS — R739 Hyperglycemia, unspecified: Secondary | ICD-10-CM | POA: Diagnosis not present

## 2021-10-05 DIAGNOSIS — I10 Essential (primary) hypertension: Secondary | ICD-10-CM

## 2021-10-05 DIAGNOSIS — I255 Ischemic cardiomyopathy: Secondary | ICD-10-CM

## 2021-10-05 DIAGNOSIS — I371 Nonrheumatic pulmonary valve insufficiency: Secondary | ICD-10-CM

## 2021-10-05 DIAGNOSIS — Q221 Congenital pulmonary valve stenosis: Secondary | ICD-10-CM | POA: Diagnosis not present

## 2021-10-05 DIAGNOSIS — I493 Ventricular premature depolarization: Secondary | ICD-10-CM

## 2021-10-05 DIAGNOSIS — E785 Hyperlipidemia, unspecified: Secondary | ICD-10-CM | POA: Diagnosis not present

## 2021-10-05 MED ORDER — COENZYME Q10 100 MG PO TABS: 200.0000 mg | ORAL_TABLET | Freq: Every day | ORAL | 2 refills | Status: DC

## 2021-10-05 NOTE — Patient Instructions (Addendum)
Medication Instructions:  Your physician recommends that you continue on your current medications as directed. Please refer to the Current Medication list given to you today.  *If you need a refill on your cardiac medications before your next appointment, please call your pharmacy*   Lab Work: TODAY-A1C,  6 WEEKS-CMET, LIPIDS If you have labs (blood work) drawn today and your tests are completely normal, you will receive your results only by: MyChart Message (if you have MyChart) OR A paper copy in the mail If you have any lab test that is abnormal or we need to change your treatment, we will call you to review the results.   Testing/Procedures: NONE ORDERED   Follow-Up: At [facility], you and your health needs are our priority.  As part of our continuing mission to provide you with exceptional heart care, we have created designated Provider Care Teams.  These Care Teams include your primary Cardiologist (physician) and Advanced Practice Providers (APPs -  Physician Assistants and Nurse Practitioners) who all work together to provide you with the care you need, when you need it.  We recommend signing up for the patient portal called "MyChart".  Sign up information is provided on this After Visit Summary.  MyChart is used to connect with patients for Virtual Visits (Telemedicine).  Patients are able to view lab/test results, encounter notes, upcoming appointments, etc.  Non-urgent messages can be sent to your provider as well.   To learn more about what you can do with MyChart, go to [email or web address].    Your next appointment:   3-4 month(s)  The format for your next appointment:   In Person  Provider:   [name], MD  or Ronie Spies, PA-C   Other Instructions   Important Information About Sugar      Do not take nitroglycerin if you have taken sildenafil in the last 48 hours. The opposite is true as well. Do not take sildenafil if you have taken nitroglycerin  in the last 48 hours. If you take these two medicines, they can cause low blood pressure if taken close together.

## 2021-10-11 LAB — HEMOGLOBIN A1C
Est. average glucose Bld gHb Est-mCnc: 126 mg/dL
Hgb A1c MFr Bld: 6 % — ABNORMAL HIGH (ref 4.8–5.6)

## 2021-10-16 ENCOUNTER — Other Ambulatory Visit: Payer: Self-pay

## 2021-10-16 DIAGNOSIS — Z713 Dietary counseling and surveillance: Secondary | ICD-10-CM

## 2021-10-22 ENCOUNTER — Other Ambulatory Visit: Payer: BC Managed Care – PPO

## 2021-11-12 ENCOUNTER — Other Ambulatory Visit: Payer: BC Managed Care – PPO

## 2021-11-14 DIAGNOSIS — E782 Mixed hyperlipidemia: Secondary | ICD-10-CM | POA: Diagnosis not present

## 2021-11-14 DIAGNOSIS — R7303 Prediabetes: Secondary | ICD-10-CM | POA: Diagnosis not present

## 2021-11-14 DIAGNOSIS — I2510 Atherosclerotic heart disease of native coronary artery without angina pectoris: Secondary | ICD-10-CM | POA: Diagnosis not present

## 2021-11-14 DIAGNOSIS — I10 Essential (primary) hypertension: Secondary | ICD-10-CM | POA: Diagnosis not present

## 2022-01-11 DIAGNOSIS — Z23 Encounter for immunization: Secondary | ICD-10-CM | POA: Diagnosis not present

## 2022-02-01 ENCOUNTER — Ambulatory Visit: Payer: BC Managed Care – PPO | Attending: Cardiovascular Disease | Admitting: Cardiovascular Disease

## 2022-02-01 ENCOUNTER — Encounter: Payer: Self-pay | Admitting: Cardiovascular Disease

## 2022-02-01 VITALS — BP 132/80 | HR 79 | Ht 71.0 in | Wt 240.2 lb

## 2022-02-01 DIAGNOSIS — E785 Hyperlipidemia, unspecified: Secondary | ICD-10-CM | POA: Diagnosis not present

## 2022-02-01 DIAGNOSIS — I10 Essential (primary) hypertension: Secondary | ICD-10-CM | POA: Diagnosis not present

## 2022-02-01 DIAGNOSIS — I2510 Atherosclerotic heart disease of native coronary artery without angina pectoris: Secondary | ICD-10-CM

## 2022-02-01 DIAGNOSIS — I255 Ischemic cardiomyopathy: Secondary | ICD-10-CM | POA: Diagnosis not present

## 2022-02-01 NOTE — Patient Instructions (Signed)
Medication Instructions:  Your physician recommends that you continue on your current medications as directed. Please refer to the Current Medication list given to you today.  *If you need a refill on your cardiac medications before your next appointment, please call your pharmacy*   Lab Work: CMET, Lipids (already ordered) If you have labs (blood work) drawn today and your tests are completely normal, you will receive your results only by: MyChart Message (if you have MyChart) OR A paper copy in the mail If you have any lab test that is abnormal or we need to change your treatment, we will call you to review the results.  Testing/Procedures: ECHO Your physician has requested that you have an echocardiogram. Echocardiography is a painless test that uses sound waves to create images of your heart. It provides your doctor with information about the size and shape of your heart and how well your heart's chambers and valves are working. This procedure takes approximately one hour. There are no restrictions for this procedure. Please do NOT wear cologne, perfume, aftershave, or lotions (deodorant is allowed). Please arrive 15 minutes prior to your appointment time.  Follow-Up: At [facility], you and your health needs are our priority.  As part of our continuing mission to provide you with exceptional heart care, we have created designated Provider Care Teams.  These Care Teams include your primary Cardiologist (physician) and Advanced Practice Providers (APPs -  Physician Assistants and Nurse Practitioners) who all work together to provide you with the care you need, when you need it.  Your next appointment:   6 month(s)  The format for your next appointment:   In Person  Provider:   [name], MD       Important Information About Sugar

## 2022-02-01 NOTE — Progress Notes (Unsigned)
Cardiology Office Note:    Date:  02/02/2022   ID:  [name], DOB 1975-11-24, MRN [id number]  PCP:  Patient, No Pcp Per   [facility] Providers Cardiologist:  [name], MD     Referring MD: No ref. provider found   Chief Complaint  Patient presents with   Coronary Artery Disease    History of Present Illness:    Brandon Kemp is a 46 y.o. 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   The patient is here alone today. He is doing well. Today, he denies symptoms of palpitations, chest pain, shortness of breath, orthopnea, PND, lower extremity edema, dizziness, or syncope. He is physically active without exertional symptoms. Reports BP has been better controlled.     Past Medical History:  Diagnosis Date   CAD (coronary artery disease)    Congenital pulmonic valve stenosis    a. s/p surgical repair @ Age 61 in [place];  b.  12/12  Echo: EF 55-60%, nl wall motion, mild LVH, moderate PR.   History of tobacco abuse    Hypertension    Ischemic cardiomyopathy    Relationship problem between partners 12/07/2017   Symptomatic PVCs    a. 09/2011    Past Surgical History:  Procedure Laterality Date   ANKLE SURGERY     CORONARY STENT INTERVENTION N/A 09/26/2021   Procedure: CORONARY STENT INTERVENTION;  Surgeon: [name], MD;  Location: [facility] INVASIVE CV LAB;  Service: Cardiovascular;  Laterality: N/A;   HAND SURGERY     INTRAVASCULAR ULTRASOUND/IVUS N/A 09/26/2021   Procedure: Intravascular Ultrasound/IVUS;  Surgeon: [name], MD;  Location: [facility] INVASIVE CV LAB;  Service: Cardiovascular;  Laterality: N/A;   LEFT HEART CATH AND CORONARY ANGIOGRAPHY N/A 09/26/2021   Procedure: LEFT HEART CATH AND  CORONARY ANGIOGRAPHY;  Surgeon: [name], MD;  Location: [facility] INVASIVE CV LAB;  Service: Cardiovascular;  Laterality: N/A;   pulmonary outflow patch  age 82   pulmonary valvotomy  age 82    Current Medications: Current Meds  Medication Sig   aspirin EC 81 MG tablet Take 81 mg by mouth daily. Swallow whole.   Coenzyme Q10 100 MG TABS Take 2 tablets (200 mg total) by mouth daily.   Magnesium 500 MG TABS Take 500 mg by mouth daily.   methylphenidate (RITALIN LA) 10 MG 24 hr capsule Take 10 mg by mouth daily.    metoprolol succinate (TOPROL XL) 25 MG 24 hr tablet Take 1 tablet (25 mg total) by mouth daily.   nitroGLYCERIN (NITROSTAT) 0.4 MG SL tablet Place 1 tablet (0.4 mg total) under the tongue every 5 (five) minutes as needed.   rosuvastatin (CRESTOR) 20 MG tablet Take 1 tablet (20 mg total) by mouth daily.   sildenafil (REVATIO) 20 MG tablet Take 20 mg by mouth daily as needed (ED).   telmisartan (MICARDIS) 40 MG tablet Take 1 tablet (40 mg total) by mouth daily.   ticagrelor (BRILINTA) 90 MG TABS tablet Take 1 tablet (90 mg total) by mouth 2 (two) times daily.     Allergies:   Patient has no known allergies.   Social History   Socioeconomic History   Marital status: Married    Spouse name: Not  on file   Number of children: Not on file   Years of education: Not on file   Highest education level: Bachelor's degree (e.g., BA, AB, BS)  Occupational History   Not on file  Tobacco Use   Smoking status: Former   Smokeless tobacco: Never   Tobacco comments:    sometimes, when stressed about 2 cigarettes a day  Substance and Sexual Activity   Alcohol use: No    Comment: social   Drug use: No   Sexual activity: Not Currently    Birth control/protection: Abstinence  Other Topics Concern   Not on file  Social History Narrative   Not on file   Social Determinants of Health   Financial Resource Strain: Not on file  Food Insecurity: Not on file  Transportation Needs: Not on  file  Physical Activity: Not on file  Stress: Not on file  Social Connections: Not on file     Family History: The patient's family history includes Hypertension in his father; Evelene Croon Parkinson White syndrome in his brother.  ROS:   Please see the history of present illness.    All other systems reviewed and are negative.  EKGs/Labs/Other Studies Reviewed:    The following studies were reviewed today: Echo 08/30/2021: 1. Left ventricular ejection fraction, by estimation, is 45 to 50%. The  left ventricle has mildly decreased function. The left ventricle has no  regional wall motion abnormalities. Left ventricular diastolic parameters  were normal. The average left  ventricular global longitudinal strain is -13.2 %. The global longitudinal  strain is abnormal.   2. Right ventricular systolic function is normal. The right ventricular  size is mildly enlarged. There is normal pulmonary artery systolic  pressure.   3. The mitral valve is normal in structure. Trivial mitral valve  regurgitation.   4. The aortic valve is normal in structure. Aortic valve regurgitation is  not visualized.   5. The pulmonic valve was abnormal. Mild pulmonic stenosis.   6. The inferior vena cava is normal in size with greater than 50%  respiratory variability, suggesting right atrial pressure of 3 mmHg.   EKG:  EKG is not ordered today.    Recent Labs: 09/25/2021: BUN 12; Creatinine, Ser 1.13; Hemoglobin 16.3; Platelets 240; Potassium 5.0; Sodium 137  Recent Lipid Panel    Component Value Date/Time   CHOL 265 (H) 02/17/2019 1143   TRIG 166 (H) 02/17/2019 1143   HDL 45 02/17/2019 1143   CHOLHDL 5.9 (H) 02/17/2019 1143   CHOLHDL 5 09/09/2014 0902   VLDL 23.6 09/09/2014 0902   LDLCALC 189 (H) 02/17/2019 1143   LDLDIRECT 155.7 09/26/2011 1031     Risk Assessment/Calculations:                Physical Exam:    VS:  BP 132/80 (BP Location: Left Arm, Patient Position: Sitting, Cuff Size:  Large)   Pulse 79   Ht 5\' 11"  (1.803 m)   Wt 240 lb 3.2 oz (109 kg)   SpO2 95%   BMI 33.50 kg/m     Wt Readings from Last 3 Encounters:  02/01/22 240 lb 3.2 oz (109 kg)  10/05/21 238 lb 12.8 oz (108.3 kg)  09/26/21 236 lb (107 kg)     GEN:  Well nourished, well developed in no acute distress HEENT: Normal NECK: No JVD; No carotid bruits LYMPHATICS: No lymphadenopathy CARDIAC: RRR, no murmurs, rubs, gallops RESPIRATORY:  Clear to auscultation without rales, wheezing or rhonchi  ABDOMEN: Soft,  non-tender, non-distended MUSCULOSKELETAL:  No edema; No deformity  SKIN: Warm and dry NEUROLOGIC:  Alert and oriented x 3 PSYCHIATRIC:  Normal affect   ASSESSMENT:    1. Ischemic cardiomyopathy   2. Hyperlipidemia LDL goal <70   3. Coronary artery disease involving native coronary artery of native heart without angina pectoris   4. Essential hypertension    PLAN:    In order of problems listed above:  Most recent echo reviewed and suspect reduced LVEF is secondary to ischemic heart disease, possibly with component of uncontrolled HTN. Now s/p revascularization and BP much better controlled. Repeat echo ordered to reassess. Continue telmisartan and metoprolol succinate. NYHA 1. Treated with high intensity statin drug. Need to update labs to assess treatment response. Goal LDL < 55 mg/dL.  Continue ASA and ticagrelor x 12 months. Risk redction measures as outlined. Discussed diet, weight loss, risk modification. BP well controlled on metoprolol succinate and telmisartan.            Medication Adjustments/Labs and Tests Ordered: Current medicines are reviewed at length with the patient today.  Concerns regarding medicines are outlined above.  Orders Placed This Encounter  Procedures   ECHOCARDIOGRAM COMPLETE   No orders of the defined types were placed in this encounter.   Patient Instructions  Medication Instructions:  Your physician recommends that you continue on your  current medications as directed. Please refer to the Current Medication list given to you today.  *If you need a refill on your cardiac medications before your next appointment, please call your pharmacy*   Lab Work: CMET, Lipids (already ordered) If you have labs (blood work) drawn today and your tests are completely normal, you will receive your results only by: MyChart Message (if you have MyChart) OR A paper copy in the mail If you have any lab test that is abnormal or we need to change your treatment, we will call you to review the results.  Testing/Procedures: ECHO Your physician has requested that you have an echocardiogram. Echocardiography is a painless test that uses sound waves to create images of your heart. It provides your doctor with information about the size and shape of your heart and how well your heart's chambers and valves are working. This procedure takes approximately one hour. There are no restrictions for this procedure. Please do NOT wear cologne, perfume, aftershave, or lotions (deodorant is allowed). Please arrive 15 minutes prior to your appointment time.  Follow-Up: At [facility], you and your health needs are our priority.  As part of our continuing mission to provide you with exceptional heart care, we have created designated Provider Care Teams.  These Care Teams include your primary Cardiologist (physician) and Advanced Practice Providers (APPs -  Physician Assistants and Nurse Practitioners) who all work together to provide you with the care you need, when you need it.  Your next appointment:   6 month(s)  The format for your next appointment:   In Person  Provider:   [name], MD       Important Information About Sugar         Signed, [name], MD  02/02/2022 9:27 AM    [facility]

## 2022-02-07 ENCOUNTER — Ambulatory Visit: Payer: BC Managed Care – PPO | Attending: Physician Assistant

## 2022-02-07 DIAGNOSIS — R9431 Abnormal electrocardiogram [ECG] [EKG]: Secondary | ICD-10-CM

## 2022-02-07 DIAGNOSIS — Q221 Congenital pulmonary valve stenosis: Secondary | ICD-10-CM

## 2022-02-07 DIAGNOSIS — R931 Abnormal findings on diagnostic imaging of heart and coronary circulation: Secondary | ICD-10-CM

## 2022-02-07 DIAGNOSIS — I10 Essential (primary) hypertension: Secondary | ICD-10-CM

## 2022-02-07 LAB — LIPID PANEL
Chol/HDL Ratio: 4.4 ratio (ref 0.0–5.0)
Cholesterol, Total: 188 mg/dL (ref 100–199)
HDL: 43 mg/dL (ref >39)
LDL Chol Calc (NIH): 120 mg/dL — ABNORMAL HIGH (ref 0–99)
Triglycerides: 138 mg/dL (ref 0–149)
VLDL Cholesterol Cal: 25 mg/dL (ref 5–40)

## 2022-02-07 LAB — COMPREHENSIVE METABOLIC PANEL
ALT: 39 IU/L (ref 0–44)
AST: 27 IU/L (ref 0–40)
Albumin/Globulin Ratio: 2.6 — ABNORMAL HIGH (ref 1.2–2.2)
Albumin: 4.7 g/dL (ref 4.1–5.1)
Alkaline Phosphatase: 61 IU/L (ref 44–121)
BUN/Creatinine Ratio: 15 (ref 9–20)
BUN: 17 mg/dL (ref 6–24)
Bilirubin Total: 0.8 mg/dL (ref 0.0–1.2)
CO2: 25 mmol/L (ref 20–29)
Calcium: 9.9 mg/dL (ref 8.7–10.2)
Chloride: 101 mmol/L (ref 96–106)
Creatinine, Ser: 1.15 mg/dL (ref 0.76–1.27)
Globulin, Total: 1.8 g/dL (ref 1.5–4.5)
Glucose: 109 mg/dL — ABNORMAL HIGH (ref 70–99)
Potassium: 5.2 mmol/L (ref 3.5–5.2)
Sodium: 139 mmol/L (ref 134–144)
Total Protein: 6.5 g/dL (ref 6.0–8.5)
eGFR: 79 mL/min/{1.73_m2} (ref >59)

## 2022-02-20 ENCOUNTER — Ambulatory Visit (HOSPITAL_COMMUNITY): Payer: BC Managed Care – PPO | Attending: Cardiovascular Disease

## 2022-02-20 DIAGNOSIS — E785 Hyperlipidemia, unspecified: Secondary | ICD-10-CM

## 2022-02-20 DIAGNOSIS — I2510 Atherosclerotic heart disease of native coronary artery without angina pectoris: Secondary | ICD-10-CM | POA: Diagnosis not present

## 2022-02-20 DIAGNOSIS — I10 Essential (primary) hypertension: Secondary | ICD-10-CM

## 2022-02-20 DIAGNOSIS — I255 Ischemic cardiomyopathy: Secondary | ICD-10-CM

## 2022-02-20 LAB — ECHOCARDIOGRAM COMPLETE
Area-P 1/2: 3.83 cm2
S' Lateral: 3.45 cm

## 2022-03-11 ENCOUNTER — Telehealth: Payer: Self-pay | Admitting: Cardiovascular Disease

## 2022-03-11 ENCOUNTER — Encounter: Payer: Self-pay | Admitting: Cardiovascular Disease

## 2022-03-11 NOTE — Telephone Encounter (Signed)
Calling to say that he fine with the change up on his meds, that Dr. Burt Knack wants to try. Please advide

## 2022-03-13 ENCOUNTER — Telehealth: Payer: Self-pay | Admitting: Cardiovascular Disease

## 2022-03-13 DIAGNOSIS — E7800 Pure hypercholesterolemia, unspecified: Secondary | ICD-10-CM

## 2022-03-13 DIAGNOSIS — Z79899 Other long term (current) drug therapy: Secondary | ICD-10-CM

## 2022-03-13 MED ORDER — EZETIMIBE 10 MG PO TABS: 10.0000 mg | ORAL_TABLET | Freq: Every day | ORAL | 3 refills | Status: DC

## 2022-03-13 MED ORDER — ROSUVASTATIN CALCIUM 40 MG PO TABS: 40.0000 mg | ORAL_TABLET | Freq: Every day | ORAL | 3 refills | Status: DC

## 2022-03-13 NOTE — Telephone Encounter (Signed)
Donnalee Curry K 03/11/2022 11:01 AM EST Back to Top    Fourth and final attempt to reach patient was unsuccessful. Will mail letter today asking pt to call office for results and review physician recommendations.   Thompson Grayer, RN 03/07/2022 12:04 PM EST     Left message to call office   Ma Hillock 02/21/2022 11:37 AM EST     Patient called. Left detailed message per DPR and asked pt to call office back to approve med changes and needed labs.   Donnalee Curry K 02/15/2022 10:37 AM EST     Left message of results (DPR on file) and to call back if okay with MD recommendations. Awaiting call back.   Sherren Mocha, MD 02/14/2022 10:19 PM EST     Labs trending better but LDL not close to goal of < 55 mg/dL. Would increase crestor to 40 mg daily and add zetia 10 mg daily, repeat labs 3 months. thx    Patient called back into office after receiving letter stating that he is okay with recommendations of Dr Burt Knack. Medications sent to pharmacy on file, labs ordered and scheduled for 06/12/22.

## 2022-03-13 NOTE — Telephone Encounter (Signed)
-----   Message from Sherren Mocha, MD sent at 02/14/2022 10:19 PM EST ----- Labs trending better but LDL not close to goal of < 55 mg/dL. Would increase crestor to 40 mg daily and add zetia 10 mg daily, repeat labs 3 months. thx

## 2022-04-01 DIAGNOSIS — Z6834 Body mass index (BMI) 34.0-34.9, adult: Secondary | ICD-10-CM | POA: Diagnosis not present

## 2022-04-01 DIAGNOSIS — E7800 Pure hypercholesterolemia, unspecified: Secondary | ICD-10-CM | POA: Diagnosis not present

## 2022-04-01 DIAGNOSIS — E669 Obesity, unspecified: Secondary | ICD-10-CM | POA: Diagnosis not present

## 2022-04-05 ENCOUNTER — Telehealth: Payer: Self-pay

## 2022-04-05 NOTE — Telephone Encounter (Signed)
Pt on Brilinta called to see if he was still taking and he confirmed that he is. Pt understand per protocol he has to have OV prior to his procedure. Pt voiced waiting to keep his colonoscopy scheduled for 05/05/32 with Dr. Hilarie Fredrickson but understands this may not be doable as is unable to come on for an OV until 05/03/22. Pt stated that he could stop taking his blood thinner in preparation to be able to keep his procedure date. Pt advised that for his safety he should not stop his blood thinner without consulting his cardiologist and getting approval. Pt verbalized understanding. Pt said he would call his cardiologist for recommendations. Pt is aware that we may still have to cancel his procedure on 3/4 due to clearance reasons.

## 2022-04-29 ENCOUNTER — Telehealth: Payer: Self-pay | Admitting: *Deleted

## 2022-04-29 NOTE — Telephone Encounter (Signed)
Not urgent. This is for screening. I have spoken to Ellouise Newer, PA-C (whom patient is scheduled to discuss procedure with 05/03/22). She indicates that we should hold off on procedure at this time and place recall for 09/2022 at which time it may be more appropriate to do the screening procedure.  Cards-thank you for your input on this!   I have left a message for patient to call back so that I can explain next steps forward.

## 2022-04-29 NOTE — Telephone Encounter (Signed)
Request for surgical clearance:     Endoscopy Procedure  What type of surgery is being performed?     colonoscopy  When is this surgery scheduled?     05/06/22  What type of clearance is required ?   Pharmacy  Are there any medications that need to be held prior to surgery and how long? Brilinta, 5 days  Practice name and name of physician performing surgery?      [facility]  What is your office phone and fax number?      Phone- [phone]  [phone]  Anesthesia type (None, local, MAC, general) ?       MAC

## 2022-04-29 NOTE — Telephone Encounter (Signed)
I have spoken to patient to explain that unfortunately, due to his PCI 09/2021 and need for Brilinta x 12 months uninterrupted, it is felt best that we hold off on scheduling any screening colonoscopy procedures until 09/2022. Patient would like to know if Dr would override this decision as he feels he is overdue for procedure and wants to go forward. I explained that while the Dr could override the decision, it is unlikely that this will occur for a screening procedure as the risks of bleeding outweigh the benefits. I did ask patient if he was experiencing any GI symptoms that are concerning to him. He explained that he had been having some smaller caliber stools but this is not a current problem. Also denies any rectal bleeding. I offered for him to keep his appointment with Ellouise Newer, PA-C on Friday to discuss his concerns to see if there were any other avenues for evaluation at this time. Patient states he would rather just have [place] send him a "recall" letter in July 2024 as previously suggested at which time he will go forward with colonoscopy pending cardiology approval. Recall placed in EPIC and appointment with Ellouise Newer, PA-C and [facility] colonoscopy both cancelled.

## 2022-04-29 NOTE — Telephone Encounter (Signed)
Covering preop today. Per chart review, patient admitted 09/2021 and underwent DES to proximal LAD with recommendation to continue ASA + Brilinta x 12 months. Will route to requesting GI personnel Dixon Boos to inquire with her team if colonoscopy is urgent. If not urgent and can be safely delayed, would recommend to defer until patient has completed 1 year of dual antiplatelet therapy. Please respond with input.   Has OV 08/2021 with Dr. Burt Knack.

## 2022-04-29 NOTE — Telephone Encounter (Signed)
Great, thank you. Please reach out to our team when patient is seen for recall and we will readdress at that time. Otherwise I will plan to close this out from our pool.  Appreciate your help!

## 2022-05-03 ENCOUNTER — Ambulatory Visit: Payer: BC Managed Care – PPO | Admitting: Physician Assistant

## 2022-05-06 ENCOUNTER — Encounter: Payer: BC Managed Care – PPO | Admitting: Internal Medicine

## 2022-05-06 ENCOUNTER — Ambulatory Visit: Payer: BC Managed Care – PPO | Admitting: Nurse Practitioner

## 2022-05-06 DIAGNOSIS — Z6834 Body mass index (BMI) 34.0-34.9, adult: Secondary | ICD-10-CM | POA: Diagnosis not present

## 2022-05-06 DIAGNOSIS — E669 Obesity, unspecified: Secondary | ICD-10-CM | POA: Diagnosis not present

## 2022-05-31 ENCOUNTER — Other Ambulatory Visit: Payer: Self-pay | Admitting: Cardiovascular Disease

## 2022-05-31 DIAGNOSIS — Q221 Congenital pulmonary valve stenosis: Secondary | ICD-10-CM

## 2022-05-31 DIAGNOSIS — R931 Abnormal findings on diagnostic imaging of heart and coronary circulation: Secondary | ICD-10-CM

## 2022-05-31 DIAGNOSIS — R9431 Abnormal electrocardiogram [ECG] [EKG]: Secondary | ICD-10-CM

## 2022-05-31 DIAGNOSIS — I10 Essential (primary) hypertension: Secondary | ICD-10-CM

## 2022-06-04 ENCOUNTER — Encounter: Payer: BC Managed Care – PPO | Admitting: Gastroenterology

## 2022-06-12 ENCOUNTER — Ambulatory Visit: Payer: BC Managed Care – PPO | Attending: Cardiovascular Disease

## 2022-06-12 DIAGNOSIS — Z79899 Other long term (current) drug therapy: Secondary | ICD-10-CM

## 2022-06-12 DIAGNOSIS — E7800 Pure hypercholesterolemia, unspecified: Secondary | ICD-10-CM

## 2022-06-13 LAB — HEPATIC FUNCTION PANEL
ALT: 52 IU/L — ABNORMAL HIGH (ref 0–44)
AST: 36 IU/L (ref 0–40)
Albumin: 4.4 g/dL (ref 4.1–5.1)
Alkaline Phosphatase: 62 IU/L (ref 44–121)
Bilirubin Total: 0.8 mg/dL (ref 0.0–1.2)
Bilirubin, Direct: 0.22 mg/dL (ref 0.00–0.40)
Total Protein: 6.4 g/dL (ref 6.0–8.5)

## 2022-06-13 LAB — LIPID PANEL
Chol/HDL Ratio: 3.2 ratio (ref 0.0–5.0)
Cholesterol, Total: 125 mg/dL (ref 100–199)
HDL: 39 mg/dL — ABNORMAL LOW (ref >39)
LDL Chol Calc (NIH): 66 mg/dL (ref 0–99)
Triglycerides: 107 mg/dL (ref 0–149)
VLDL Cholesterol Cal: 20 mg/dL (ref 5–40)

## 2022-06-14 ENCOUNTER — Telehealth: Payer: Self-pay | Admitting: Cardiovascular Disease

## 2022-06-14 DIAGNOSIS — Z111 Encounter for screening for respiratory tuberculosis: Secondary | ICD-10-CM | POA: Diagnosis not present

## 2022-06-14 DIAGNOSIS — R7401 Elevation of levels of liver transaminase levels: Secondary | ICD-10-CM

## 2022-06-14 NOTE — Telephone Encounter (Signed)
Detailed message left for patient per DPR. Repeat lab entered and scheduled for October.

## 2022-06-14 NOTE — Telephone Encounter (Signed)
-----   Message from [name], MD sent at 06/13/2022  6:12 AM EDT ----- Excellent response to statin with LDL down from 120 to 66. Mild ALT elevation noted but not a big concern - repeat 6 months. Minimize alcohol, continue current Rx.

## 2022-06-17 DIAGNOSIS — Z0279 Encounter for issue of other medical certificate: Secondary | ICD-10-CM | POA: Diagnosis not present

## 2022-08-05 ENCOUNTER — Ambulatory Visit: Payer: BC Managed Care – PPO | Admitting: Cardiovascular Disease

## 2022-08-07 ENCOUNTER — Other Ambulatory Visit: Payer: Self-pay | Admitting: Cardiovascular Disease

## 2022-08-07 DIAGNOSIS — R931 Abnormal findings on diagnostic imaging of heart and coronary circulation: Secondary | ICD-10-CM

## 2022-08-07 DIAGNOSIS — R9431 Abnormal electrocardiogram [ECG] [EKG]: Secondary | ICD-10-CM

## 2022-08-07 DIAGNOSIS — Q221 Congenital pulmonary valve stenosis: Secondary | ICD-10-CM

## 2022-08-07 DIAGNOSIS — I10 Essential (primary) hypertension: Secondary | ICD-10-CM

## 2022-09-09 ENCOUNTER — Ambulatory Visit: Payer: BC Managed Care – PPO | Attending: Cardiovascular Disease | Admitting: Cardiovascular Disease

## 2022-09-09 ENCOUNTER — Encounter: Payer: Self-pay | Admitting: Cardiovascular Disease

## 2022-09-09 VITALS — BP 126/80 | HR 97 | Ht 71.0 in | Wt 242.0 lb

## 2022-09-09 DIAGNOSIS — I255 Ischemic cardiomyopathy: Secondary | ICD-10-CM | POA: Diagnosis not present

## 2022-09-09 DIAGNOSIS — I25119 Atherosclerotic heart disease of native coronary artery with unspecified angina pectoris: Secondary | ICD-10-CM | POA: Diagnosis not present

## 2022-09-09 DIAGNOSIS — E785 Hyperlipidemia, unspecified: Secondary | ICD-10-CM | POA: Diagnosis not present

## 2022-09-09 NOTE — Progress Notes (Addendum)
Cardiology Office Note:    Date:  09/09/2022   ID:  Brandon Kemp, DOB 05/09/1975, MRN [id number]  PCP:  Aliene Beams, MD   [facility] Providers Cardiologist:  [name], MD     Referring MD: Aliene Beams, MD   Chief Complaint  Patient presents with   Coronary Artery Disease    History of Present Illness:    Brandon Kemp is a 47 y.o. 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   The patient is here alone today.  He has been doing pretty well.  He has some degree of exercise intolerance at times.  This can be associated with shortness of breath.  No exertional chest pain or pressure.  He feels like he is able to take deeper breaths since he underwent PCI.  He denies any heavy alcohol use and he quit smoking completely at the time of his PCI procedure last year.  No edema, orthopnea, PND, or heart palpitations.  He states that under stress he occasionally feels chest tightness.  This does not occur with physical exertion.  Past Medical History:  Diagnosis Date   CAD (coronary artery disease)    Congenital pulmonic valve stenosis    a. s/p surgical repair @ Age 80 in [place];  b.  12/12  Echo: EF 55-60%, nl wall motion, mild LVH, moderate PR.   History of tobacco abuse    Hypertension    Ischemic cardiomyopathy    Relationship problem between partners 12/07/2017   Symptomatic PVCs    a. 09/2011    Past Surgical History:  Procedure Laterality Date   ANKLE SURGERY     CORONARY STENT INTERVENTION N/A 09/26/2021   Procedure: CORONARY STENT INTERVENTION;  Surgeon: [name], MD;  Location: [facility] INVASIVE CV LAB;  Service: Cardiovascular;  Laterality: N/A;   CORONARY ULTRASOUND/IVUS N/A 09/26/2021    Procedure: Intravascular Ultrasound/IVUS;  Surgeon: [name], MD;  Location: [facility] INVASIVE CV LAB;  Service: Cardiovascular;  Laterality: N/A;   HAND SURGERY     LEFT HEART CATH AND CORONARY ANGIOGRAPHY N/A 09/26/2021   Procedure: LEFT HEART CATH AND CORONARY ANGIOGRAPHY;  Surgeon: [name], MD;  Location: [facility] INVASIVE CV LAB;  Service: Cardiovascular;  Laterality: N/A;   pulmonary outflow patch  age 88   pulmonary valvotomy  age 88    Current Medications: Current Meds  Medication Sig   aspirin EC 81 MG tablet Take 81 mg by mouth daily. Swallow whole.   Coenzyme Q10 100 MG TABS Take 2 tablets (200 mg total) by mouth daily.   ezetimibe (ZETIA) 10 MG tablet Take 1 tablet (10 mg total) by mouth daily.   Magnesium 500 MG TABS Take 500 mg by mouth daily.   methylphenidate (RITALIN LA) 10 MG 24 hr capsule Take 10 mg by mouth daily.    nitroGLYCERIN (NITROSTAT) 0.4 MG SL tablet Place 1 tablet (0.4 mg total) under the tongue every 5 (five) minutes as needed.   rosuvastatin (CRESTOR) 40 MG tablet Take 1 tablet (40 mg total) by mouth daily.   sildenafil (REVATIO) 20 MG tablet Take 20 mg by mouth daily as needed (ED).   telmisartan (MICARDIS) 40 MG tablet TAKE 1 TABLET BY MOUTH EVERY DAY   ticagrelor (  BRILINTA) 90 MG TABS tablet Take 1 tablet (90 mg total) by mouth 2 (two) times daily.   [DISCONTINUED] metoprolol succinate (TOPROL XL) 25 MG 24 hr tablet Take 1 tablet (25 mg total) by mouth daily.     Allergies:   Patient has no known allergies.   Social History   Socioeconomic History   Marital status: Married    Spouse name: Not on file   Number of children: Not on file   Years of education: Not on file   Highest education level: Bachelor's degree (e.g., BA, AB, BS)  Occupational History   Not on file  Tobacco Use   Smoking status: Former   Smokeless tobacco: Never   Tobacco comments:    sometimes, when stressed about 2 cigarettes a day  Substance and Sexual Activity   Alcohol  use: No    Comment: social   Drug use: No   Sexual activity: Not Currently    Birth control/protection: Abstinence  Other Topics Concern   Not on file  Social History Narrative   Not on file   Social Determinants of Health   Financial Resource Strain: Not on file  Food Insecurity: Not on file  Transportation Needs: Not on file  Physical Activity: Not on file  Stress: Not on file  Social Connections: Not on file     Family History: The patient's family history includes Hypertension in his father; Evelene Croon Parkinson White syndrome in his brother.  ROS:   Please see the history of present illness.    All other systems reviewed and are negative.  EKGs/Labs/Other Studies Reviewed:    The following studies were reviewed today: Cardiac Studies & Procedures   CARDIAC CATHETERIZATION  CARDIAC CATHETERIZATION 09/26/2021  Narrative 1.  Severe single-vessel coronary artery disease with subtotal stenosis of the proximal LAD, successfully treated with PCI using IVUS guidance (3.5 x 34 mm Onyx frontier DES) 2.  Patent left main, left circumflex, and dominant RCA with mild diffuse plaquing but no significant stenoses  Recommend: Same-day PCI discharge protocol if criteria met.  Dual antiplatelet therapy with aspirin and ticagrelor x12 months  Findings Coronary Findings Diagnostic  Dominance: Right  Left Main The left main is patent with no stenosis.  The vessel bifurcates into the LAD and left circumflex.  Left Anterior Descending Collaterals Dist LAD filled by collaterals from RPDA.  Prox LAD to Mid LAD lesion is 99% stenosed. The lesion is calcified.  First Septal Branch Collaterals 1st Sept filled by collaterals from RPDA.  Second Septal Branch Collaterals 2nd Sept filled by collaterals from RPDA.  Left Circumflex The circumflex is patent.  The vessel has mild diffuse plaquing without any high-grade stenosis.  The first OM branch is large in caliber and divides into twin  vessels.  The AV circumflex is of medium caliber and supplies 2 small posterolateral branches.  First Obtuse Marginal Branch 1st Mrg lesion is 30% stenosed.  Right Coronary Artery Vessel is large. There is mild diffuse disease throughout the vessel. There is mild diffuse plaquing throughout the RCA.  The vessel is large, dominant, without significant stenosis.  The PDA and PLA branches are patent.  The LAD fills via right to left collaterals supplied through septal perforating branches  Intervention  Prox LAD to Mid LAD lesion Stent CATH VISTA GUIDE 6FR XBLAD3.5 guide catheter was inserted. Lesion crossed with guidewire using a WIRE COUGAR XT STRL 190CM. Pre-stent angioplasty was performed using a BALLN SAPPHIRE 2.5X15. Maximum pressure:  14 atm. A drug-eluting  stent was successfully placed using a STENT ONYX FRONTIER 3.5X34. Post-stent angioplasty was performed using a BALL SAPPHIRE NC24 3.75X15. Maximum pressure:  18 atm. Post-Intervention Lesion Assessment The intervention was successful. Pre-interventional TIMI flow is 1. Post-intervention TIMI flow is 3. No complications occurred at this lesion. There is a 0% residual stenosis post intervention.   STRESS TESTS  MYOCARDIAL PERFUSION IMAGING 09/14/2021  Narrative   Findings are consistent with ischemia. The study is intermediate risk.   Nuclear stress EF: 52 %. The left ventricular ejection fraction is mildly decreased (45-54%). End diastolic cavity size is normal.   Prior study not available for comparison.  Small size, moderate intensity; reversible inducible apical lesion concerning for apical ischemia   ECHOCARDIOGRAM  ECHOCARDIOGRAM COMPLETE 02/20/2022  Narrative ECHOCARDIOGRAM REPORT    [name] Date of Exam: 02/20/2022 Medical Rec #:  [id number]         Height:       71.0 in Accession #:    [id number]        Weight:       240.2 lb Date of Birth:  1976/02/29         BSA:          2.279  m Patient Age:    46 years          BP:           132/80 mmHg Patient Gender: M                 HR:           66 bpm. Exam Location:  [address]  Procedure: 2D Echo, Cardiac Doppler, Color Doppler and Strain Analysis  Indications:    I25.5 Ischemic cardiomyopathy  History:        Patient has prior history of Echocardiogram examinations, most recent 08/30/2021. CAD, Ischemic cardiomyopathy; Risk Factors:Hypertension, Dyslipidemia and Former Smoker.  Sonographer:    Samule Ohm RDCS Referring Phys: [phone] Eldana Isip  IMPRESSIONS   1. Left ventricular ejection fraction, by estimation, is 50 to 55%. The left ventricle has low normal function. The left ventricle has no regional wall motion abnormalities. Left ventricular diastolic parameters were normal. The average left ventricular global longitudinal strain is -12.9 %. The global longitudinal strain is abnormal. 2. Right ventricular systolic function is normal. The right ventricular size is normal. 3. The mitral valve is normal in structure. No evidence of mitral valve regurgitation. No evidence of mitral stenosis. 4. The aortic valve is tricuspid. There is mild thickening of the aortic valve. Aortic valve regurgitation is not visualized. Aortic valve sclerosis is present, with no evidence of aortic valve stenosis. 5. The inferior vena cava is normal in size with greater than 50% respiratory variability, suggesting right atrial pressure of 3 mmHg.  FINDINGS Left Ventricle: Left ventricular ejection fraction, by estimation, is 50 to 55%. The left ventricle has low normal function. The left ventricle has no regional wall motion abnormalities. The average left ventricular global longitudinal strain is -12.9 %. The global longitudinal strain is abnormal. The left ventricular internal cavity size was normal in size. There is no left ventricular hypertrophy. Left ventricular diastolic parameters were normal. Normal left ventricular  filling pressure.  Right Ventricle: The right ventricular size is normal. No increase in right ventricular wall thickness. Right ventricular systolic function is normal.  Left Atrium: Left atrial size was normal in size.  Right Atrium: Right atrial size was normal in size.  Pericardium: There is no evidence of pericardial effusion.  Mitral Valve: The mitral valve is normal in structure. No evidence of mitral valve regurgitation. No evidence of mitral valve stenosis.  Tricuspid Valve: The tricuspid valve is normal in structure. Tricuspid valve regurgitation is not demonstrated. No evidence of tricuspid stenosis.  Aortic Valve: The aortic valve is tricuspid. There is mild thickening of the aortic valve. Aortic valve regurgitation is not visualized. Aortic valve sclerosis is present, with no evidence of aortic valve stenosis.  Pulmonic Valve: The pulmonic valve was not well visualized. Pulmonic valve regurgitation is mild. No evidence of pulmonic stenosis.  Aorta: The aortic root is normal in size and structure.  Venous: The inferior vena cava is normal in size with greater than 50% respiratory variability, suggesting right atrial pressure of 3 mmHg.  IAS/Shunts: No atrial level shunt detected by color flow Doppler.   LEFT VENTRICLE PLAX 2D LVIDd:         4.60 cm   Diastology LVIDs:         3.45 cm   LV e' medial:    10.10 cm/s LV PW:         1.10 cm   LV E/e' medial:  7.4 LV IVS:        1.10 cm   LV e' lateral:   11.10 cm/s LVOT diam:     2.00 cm   LV E/e' lateral: 6.7 LV SV:         72 LV SV Index:   32        2D Longitudinal Strain LVOT Area:     3.14 cm  2D Strain GLS Avg:     -12.9 %   RIGHT VENTRICLE            IVC TAPSE (M-mode): 1.6 cm     IVC diam: 0.90 cm RVSP:           20.0 mmHg  LEFT ATRIUM             Index        RIGHT ATRIUM           Index LA diam:        3.30 cm 1.45 cm/m   RA Pressure: 3.00 mmHg LA Vol (A2C):   41.4 ml 18.16 ml/m  RA Area:     18.50  cm LA Vol (A4C):   51.4 ml 22.55 ml/m  RA Volume:   55.60 ml  24.39 ml/m LA Biplane Vol: 48.4 ml 21.24 ml/m AORTIC VALVE LVOT Vmax:   117.00 cm/s LVOT Vmean:  74.600 cm/s LVOT VTI:    0.230 m  AORTA Ao Root diam: 3.30 cm Ao Asc diam:  3.10 cm  MITRAL VALVE               TRICUSPID VALVE MV Area (PHT): 3.83 cm    TR Peak grad:   17.0 mmHg MV Decel Time: 198 msec    TR Vmax:        206.00 cm/s MV E velocity: 74.40 cm/s  Estimated RAP:  3.00 mmHg MV A velocity: 49.80 cm/s  RVSP:           20.0 mmHg MV E/A ratio:  1.49 SHUNTS Systemic VTI:  0.23 m Systemic Diam: 2.00 cm  Rachelle Hora Croitoru MD Electronically signed by Thurmon Fair MD Signature Date/Time: 02/20/2022/12:33:14 PM    Final     CT SCANS  CT CARDIAC SCORING (SELF PAY ONLY) 08/30/2021  Addendum 08/30/2021 11:56 AM ADDENDUM  REPORT: 08/30/2021 10:24  EXAM: OVER-READ INTERPRETATION  CT CHEST  The following report is an over-read performed by radiologist Dr. Darlys Gales [facility] on 08/30/2021. This over-read does not include interpretation of cardiac or coronary anatomy or pathology. The coronary calcium score interpretation by the cardiologist is attached.  COMPARISON:  None.  FINDINGS: Vascular: No significant extracardiac vascular findings. Unchanged calcification of the pericardium overlying the right ventricular outflow tract.  Mediastinum/Nodes: No lymphadenopathy.  Lungs/Pleura: No focal airspace disease. No suspicious pulmonary nodules.  Upper Abdomen: No acute abnormality.  Musculoskeletal: No acute osseous abnormality. No suspicious osseous lesion. Prior median sternotomy.  IMPRESSION: No acute or significant incidental extracardiac findings in the chest.   Electronically Signed By: Caprice Renshaw M.D. On: 08/30/2021 10:24  Narrative CLINICAL DATA:  Cardiovascular Disease Risk stratification  EXAM: Coronary Calcium Score  TECHNIQUE: A gated, non-contrast computed  tomography scan of the heart was performed using 3mm slice thickness. Axial images were analyzed on a dedicated workstation. Calcium scoring of the coronary arteries was performed using the Agatston method.  FINDINGS: Coronary Calcium Score:  Left main: 83.8  Left anterior descending artery: 585  Left circumflex artery: 48.9  Right coronary artery: 149  Total: 867  Percentile: 99  Pericardium: Normal.  Ascending Aorta: Normal caliber.  Non-cardiac: See separate report from [facility].  IMPRESSION: Coronary calcium score of 867. This was 11 percentile for age-, race-, and sex-matched controls.  RECOMMENDATIONS: Coronary artery calcium (CAC) score is a strong predictor of incident coronary heart disease (CHD) and provides predictive information beyond traditional risk factors. CAC scoring is reasonable to use in the decision to withhold, postpone, or initiate statin therapy in intermediate-risk or selected borderline-risk asymptomatic adults (age 35-75 years and LDL-C >=70 to <190 mg/dL) who do not have diabetes or established atherosclerotic cardiovascular disease (ASCVD).* In intermediate-risk (10-year ASCVD risk >=7.5% to <20%) adults or selected borderline-risk (10-year ASCVD risk >=5% to <7.5%) adults in whom a CAC score is measured for the purpose of making a treatment decision the following recommendations have been made:  If CAC=0, it is reasonable to withhold statin therapy and reassess in 5 to 10 years, as long as higher risk conditions are absent (diabetes mellitus, family history of premature CHD in first degree relatives (males <55 years; females <65 years), cigarette smoking, or LDL >=190 mg/dL).  If CAC is 1 to 99, it is reasonable to initiate statin therapy for patients >=76 years of age.  If CAC is >=100 or >=75th percentile, it is reasonable to initiate statin therapy at any age.  Cardiology referral should be considered for patients  with CAC scores >=400 or >=75th percentile.  *2018 AHA/ACC/AACVPR/AAPA/ABC/ACPM/ADA/AGS/APhA/ASPC/NLA/PCNA Guideline on the Management of Blood Cholesterol: A Report of the American College of Cardiology/American Heart Association Task Force on Clinical Practice Guidelines. J Am Coll Cardiol. 2019;73(24):3168-3209.  Olga Millers, MD  Electronically Signed: By: Olga Millers M.D. On: 08/30/2021 10:09   CT SCANS  CT CARDIAC SCORING (SELF PAY ONLY) 08/08/2014  Addendum 08/08/2014  5:18 PM ADDENDUM REPORT: 08/08/2014 17:16  CLINICAL DATA:  Risk stratification  EXAM: Coronary Calcium Score  TECHNIQUE: The patient was scanned on a Siemens Sensation 16 slice scanner. Axial non-contrast 3mm slices were carried out through the heart. The data set was analyzed on a dedicated work station and scored using the Agatson method.  FINDINGS: Non-cardiac: No significant non cardiac findings on limited lung and soft tissue windows. See separate report from [facility].  Ascending Aorta:  3.2 cm  Pericardium: Some calcification of pericardium over the infundibulum of the RVOT  Coronary arteries: Multiple areas of calcification in the mid and distal LAD  IMPRESSION: Coronary calcium score of 31. This was 96th percentile for age and sex matched control.  Charlton Haws   Electronically Signed By: Charlton Haws M.D. On: 08/08/2014 17:16  Narrative EXAM: OVER-READ INTERPRETATION  CT CHEST  The following report is an over-read performed by radiologist Dr. Royal Piedra [facility] on 08/08/2014. This over-read does not include interpretation of cardiac or coronary anatomy or pathology. The coronary calcium score interpretation by the cardiologist is attached.  COMPARISON:  No priors.  FINDINGS: Within the visualized portions of the thorax there are no suspicious appearing pulmonary nodules or masses, there is no acute consolidative airspace  disease, no pleural effusion, no pneumothorax and no lymphadenopathy. Some pericardial calcification is identified overlying the infundibulum. Visualized portions of the upper abdomen are unremarkable. There are no aggressive appearing lytic or blastic lesions noted in the visualized portions of the skeleton. Diminutive median sternotomy wires are noted, presumably from remote child with median sternotomy.  IMPRESSION: 1. No significant incidental noncardiac findings noted. 2. 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  Electronically Signed: By: Trudie Reed M.D. On: 08/08/2014 16:45          EKG Interpretation Date/Time:  Monday September 09 2022 09:24:05 EDT Ventricular Rate:  97 PR Interval:  152 QRS Duration:  90 QT Interval:  348 QTC Calculation: 441 R Axis:   78  Text Interpretation: Normal sinus rhythm Normal ECG When compared with ECG of 26-Sep-2021 13:00, T wave inversion no longer evident in Lateral leads Confirmed by [name] [phone]) on 09/09/2022 1:03:33 PM    Recent Labs: 09/25/2021: Hemoglobin 16.3; Platelets 240 02/07/2022: BUN 17; Creatinine, Ser 1.15; Potassium 5.2; Sodium 139 06/12/2022: ALT 52  Recent Lipid Panel    Component Value Date/Time   CHOL 125 06/12/2022 0845   TRIG 107 06/12/2022 0845   HDL 39 (L) 06/12/2022 0845   CHOLHDL 3.2 06/12/2022 0845   CHOLHDL 5 09/09/2014 0902   VLDL 23.6 09/09/2014 0902   LDLCALC 66 06/12/2022 0845   LDLDIRECT 155.7 09/26/2011 1031     Risk Assessment/Calculations:                Physical Exam:    VS:  BP 126/80   Pulse 97   Ht 5\' 11"  (1.803 m)   Wt 242 lb (109.8 kg)   SpO2 97%   BMI 33.75 kg/m     Wt Readings from Last 3 Encounters:  09/09/22 242 lb (109.8 kg)  02/01/22 240 lb 3.2 oz (109 kg)  10/05/21 238 lb 12.8 oz (108.3 kg)     GEN:  Well nourished, well developed in no acute distress HEENT:  Normal NECK: No JVD; No carotid bruits LYMPHATICS: No lymphadenopathy CARDIAC: RRR, no murmurs, rubs, gallops RESPIRATORY:  Clear to auscultation without rales, wheezing or rhonchi  ABDOMEN: Soft, non-tender, non-distended MUSCULOSKELETAL:  No edema; No deformity  SKIN: Warm and dry NEUROLOGIC:  Alert and oriented x 3 PSYCHIATRIC:  Normal affect   ASSESSMENT:    1. Ischemic cardiomyopathy   2. Coronary artery disease involving native coronary artery of native heart with angina pectoris (HCC)   3. Hyperlipidemia LDL goal <70    PLAN:    In order of problems listed above:  LVEF 50 to 55% on last echo assessment.  Patient treated with an ARB.  Will check a Myoview scan (see below).  Otherwise continue current therapy.  No signs or symptoms of heart failure. The patient continues on DAPT with aspirin and ticagrelor.  He has some chest pressure with typical and atypical features with history of proximal LAD stenting.  He will have an exercise Myoview stress scan.  If his stress study is low risk, I would anticipate stopping his ticagrelor and having him continue long-term aspirin at low-dose.  He otherwise will continue on his high intensity statin drug and Zetia. He has had an excellent response to rosuvastatin and Zetia.  We discussed lifestyle modification today.  His weight is up to 242 pounds and he is motivated to lose weight through diet and exercise.  His LDL cholesterol is 66.      Informed Consent   Shared Decision Making/Informed Consent The risks [chest pain, shortness of breath, cardiac arrhythmias, dizziness, blood pressure fluctuations, myocardial infarction, stroke/transient ischemic attack, nausea, vomiting, allergic reaction, radiation exposure, metallic taste sensation and life-threatening complications (estimated to be 1 in 10,000)], benefits (risk stratification, diagnosing coronary artery disease, treatment guidance) and alternatives of a nuclear stress test were  discussed in detail with Mr. Scull and he agrees to proceed.       Medication Adjustments/Labs and Tests Ordered: Current medicines are reviewed at length with the patient today.  Concerns regarding medicines are outlined above.  Orders Placed This Encounter  Procedures   MYOCARDIAL PERFUSION IMAGING   EKG 12-Lead   No orders of the defined types were placed in this encounter.   Patient Instructions  Medication Instructions:  Your physician recommends that you continue on your current medications as directed. Please refer to the Current Medication list given to you today.  *If you need a refill on your cardiac medications before your next appointment, please call your pharmacy*   Lab Work: NONE If you have labs (blood work) drawn today and your tests are completely normal, you will receive your results only by: MyChart Message (if you have MyChart) OR A paper copy in the mail If you have any lab test that is abnormal or we need to change your treatment, we will call you to review the results.   Testing/Procedures: Exercise Myoview Stress Test Your physician has requested that you have en exercise stress myoview. For further information please visit [email or web address] Please follow instruction sheet, as given.  Follow-Up: At [facility], you and your health needs are our priority.  As part of our continuing mission to provide you with exceptional heart care, we have created designated Provider Care Teams.  These Care Teams include your primary Cardiologist (physician) and Advanced Practice Providers (APPs -  Physician Assistants and Nurse Practitioners) who all work together to provide you with the care you need, when you need it.  Your next appointment:   1 year(s)  Provider:   [name], MD       Other Instructions See Separate sheet for stress test instructions   Signed, [name], MD  09/09/2022 1:03 PM    [facility]  Stress test  normal. Ok to stop brilinta - needs to remain on ASA 81 mg long-term. Cecille Po 09/13/2022 5:09 PM

## 2022-09-09 NOTE — Patient Instructions (Signed)
Medication Instructions:  Your physician recommends that you continue on your current medications as directed. Please refer to the Current Medication list given to you today.  *If you need a refill on your cardiac medications before your next appointment, please call your pharmacy*   Lab Work: NONE If you have labs (blood work) drawn today and your tests are completely normal, you will receive your results only by: MyChart Message (if you have MyChart) OR A paper copy in the mail If you have any lab test that is abnormal or we need to change your treatment, we will call you to review the results.   Testing/Procedures: Exercise Myoview Stress Test Your physician has requested that you have en exercise stress myoview. For further information please visit [email or web address] Please follow instruction sheet, as given.  Follow-Up: At [facility], you and your health needs are our priority.  As part of our continuing mission to provide you with exceptional heart care, we have created designated Provider Care Teams.  These Care Teams include your primary Cardiologist (physician) and Advanced Practice Providers (APPs -  Physician Assistants and Nurse Practitioners) who all work together to provide you with the care you need, when you need it.  Your next appointment:   1 year(s)  Provider:   [name], MD       Other Instructions See Separate sheet for stress test instructions

## 2022-09-12 ENCOUNTER — Telehealth (HOSPITAL_COMMUNITY): Payer: Self-pay | Admitting: *Deleted

## 2022-09-12 NOTE — Telephone Encounter (Signed)
Spoke with patient and he was given detailed instructions about his Stress Test on 09/13/22 at 7:45.

## 2022-09-13 ENCOUNTER — Ambulatory Visit (HOSPITAL_COMMUNITY): Payer: BC Managed Care – PPO | Attending: Cardiovascular Disease

## 2022-09-13 DIAGNOSIS — I25119 Atherosclerotic heart disease of native coronary artery with unspecified angina pectoris: Secondary | ICD-10-CM | POA: Insufficient documentation

## 2022-09-13 DIAGNOSIS — I255 Ischemic cardiomyopathy: Secondary | ICD-10-CM | POA: Diagnosis not present

## 2022-09-13 LAB — MYOCARDIAL PERFUSION IMAGING
Angina Index: 0
Duke Treadmill Score: 9
Estimated workload: 10.1
Exercise duration (min): 9 min
Exercise duration (sec): 0 s
MPHR: 173 {beats}/min
Peak HR: 157 {beats}/min
Percent HR: 90 %
Rest HR: 74 {beats}/min
Rest Nuclear Isotope Dose: 10.9 mCi
ST Depression (mm): 0 mm
Stress Nuclear Isotope Dose: 32.3 mCi

## 2022-09-13 MED ORDER — TECHNETIUM TC 99M TETROFOSMIN IV KIT
32.3000 | PACK | Freq: Once | INTRAVENOUS | Status: AC | PRN
  Administered 2022-09-13: 32.3 via INTRAVENOUS

## 2022-09-13 MED ORDER — TECHNETIUM TC 99M TETROFOSMIN IV KIT
10.9000 | PACK | Freq: Once | INTRAVENOUS | Status: AC | PRN
  Administered 2022-09-13: 10.9 via INTRAVENOUS

## 2022-09-17 ENCOUNTER — Telehealth: Payer: Self-pay

## 2022-09-17 NOTE — Telephone Encounter (Signed)
-----   Message from [name] sent at 09/13/2022  5:10 PM EDT ----- I made an addendum to my note. He can stop brilinta. thx ----- Message ----- From: Maisie Fus, MD Sent: 09/13/2022   2:37 PM EDT To: [name], MD

## 2022-09-17 NOTE — Telephone Encounter (Signed)
Message Received: 4 days ago [name], MD  Lars Mage, RN I made an addendum to my note. He can stop brilinta. Thx   Brilinta removed from chart. Spoke with Dr Excell Seltzer about results via phone.

## 2022-11-05 ENCOUNTER — Other Ambulatory Visit: Payer: Self-pay

## 2022-11-05 ENCOUNTER — Telehealth: Payer: Self-pay

## 2022-11-05 NOTE — Telephone Encounter (Signed)
Called pt to schedule screening colon in the [facility] 11/15/22 at 11am with Dr. Adela Lank. Pt scheduled for a previsit 11/07/22 at 2pm. Pt has hx of chf but EF 45-50%. Pt states he does not have insurance at this time, he requested someone call him with an estimate for procedure. Message sent to Precert team to contact pt regarding potential cost.

## 2022-11-06 NOTE — Telephone Encounter (Signed)
Pt has decided to keep the appts as scheduled.

## 2022-11-07 ENCOUNTER — Telehealth: Payer: Self-pay | Admitting: *Deleted

## 2022-11-07 NOTE — Telephone Encounter (Signed)
Attempt to reach pt for pre-visit. LM with call back #.  Will attempt to reach again in 5 min due to no other # listed in profile 2nd attempt had to LM. Gave call back # and instructions to call by end of day and reschedule pre-visit with RN or procedure will be canceled. No show letter to be sent by My Chart and mail.

## 2022-11-08 ENCOUNTER — Ambulatory Visit (AMBULATORY_SURGERY_CENTER): Payer: Self-pay | Admitting: *Deleted

## 2022-11-08 VITALS — Ht 71.0 in | Wt 240.0 lb

## 2022-11-08 DIAGNOSIS — Z1211 Encounter for screening for malignant neoplasm of colon: Secondary | ICD-10-CM

## 2022-11-08 MED ORDER — NA SULFATE-K SULFATE-MG SULF 17.5-3.13-1.6 GM/177ML PO SOLN: 1.0000 | Freq: Once | ORAL | 0 refills | Status: AC

## 2022-11-08 NOTE — Progress Notes (Signed)
Pt's name and DOB verified at the beginning of the pre-visit.  Pt denies any difficulty with ambulating,sitting, laying down or rolling side to side Gave both [facility] main # and MD on call # prior to instructions.  No egg or soy allergy known to patient  No issues known to pt with past sedation with any surgeries or procedures Pt denies having issues being intubated Pt has no issues moving head neck or swallowing No FH of Malignant Hyperthermia Pt is not on diet pills Pt is not on home 02  Pt is not on blood thinners  Pt denies issues with constipation  Pt is not on dialysis Pt denise any abnormal heart rhythms  Pt denies any upcoming cardiac testing Pt encouraged to use to use Singlecare or Goodrx to reduce cost  Patient's chart reviewed by Cathlyn Parsons CNRA prior to pre-visit and patient appropriate for the [facility].  Pre-visit completed and red dot placed by patient's name on their procedure day (on provider's schedule).  . Visit in person Pt states weight is 240lb Instructed pt why it is important to and  to call if they have any changes in health or new medications. Directed them to the # given and on instructions.   Pt states they will.  Instructions reviewed with pt and pt states understanding. Instructed to review again prior to procedure. Pt states they will.  Instructions given to pt with coupon and by my chart

## 2022-11-14 ENCOUNTER — Encounter: Payer: Self-pay | Admitting: Gastroenterology

## 2022-11-15 ENCOUNTER — Encounter: Payer: PRIVATE HEALTH INSURANCE | Admitting: Gastroenterology

## 2022-11-15 ENCOUNTER — Ambulatory Visit (AMBULATORY_SURGERY_CENTER): Payer: PRIVATE HEALTH INSURANCE | Admitting: Gastroenterology

## 2022-11-15 ENCOUNTER — Encounter: Payer: BC Managed Care – PPO | Admitting: Gastroenterology

## 2022-11-15 ENCOUNTER — Encounter: Payer: Self-pay | Admitting: Gastroenterology

## 2022-11-15 VITALS — BP 122/75 | HR 78 | Temp 98.9°F | Resp 14 | Ht 71.0 in | Wt 240.0 lb

## 2022-11-15 DIAGNOSIS — D124 Benign neoplasm of descending colon: Secondary | ICD-10-CM | POA: Diagnosis not present

## 2022-11-15 DIAGNOSIS — D120 Benign neoplasm of cecum: Secondary | ICD-10-CM | POA: Diagnosis not present

## 2022-11-15 DIAGNOSIS — Z1211 Encounter for screening for malignant neoplasm of colon: Secondary | ICD-10-CM

## 2022-11-15 DIAGNOSIS — D123 Benign neoplasm of transverse colon: Secondary | ICD-10-CM

## 2022-11-15 MED ORDER — SODIUM CHLORIDE 0.9 % IV SOLN: 500.0000 mL | Freq: Once | INTRAVENOUS | Status: DC

## 2022-11-15 NOTE — Op Note (Signed)
[facility] Patient Name: Brandon Kemp Procedure Date: 11/15/2022 10:55 AM MRN: [id number] Endoscopist: Viviann Spare P. Adela Lank , MD, [id number] Age: 47 Referring MD:  Date of Birth: 01-30-1976 Gender: Male Account #: [id number] Procedure:                Colonoscopy Indications:              Screening for colorectal malignant neoplasm, This                            is the patient's first colonoscopy Medicines:                Monitored Anesthesia Care Procedure:                Pre-Anesthesia Assessment:                           - Prior to the procedure, a History and Physical                            was performed, and patient medications and                            allergies were reviewed. The patient's tolerance of                            previous anesthesia was also reviewed. The risks                            and benefits of the procedure and the sedation                            options and risks were discussed with the patient.                            All questions were answered, and informed consent                            was obtained. Prior Anticoagulants: The patient has                            taken no anticoagulant or antiplatelet agents. ASA                            Grade Assessment: III - A patient with severe                            systemic disease. After reviewing the risks and                            benefits, the patient was deemed in satisfactory                            condition to undergo the procedure.  After obtaining informed consent, the colonoscope                            was passed under direct vision. Throughout the                            procedure, the patient's blood pressure, pulse, and                            oxygen saturations were monitored continuously. The                            Olympus CF-HQ190L ([id number]) Colonoscope was                            introduced through  the anus and advanced to the the                            cecum, identified by appendiceal orifice and                            ileocecal valve. The colonoscopy was performed                            without difficulty. The patient tolerated the                            procedure well. The quality of the bowel                            preparation was good. The ileocecal valve,                            appendiceal orifice, and rectum were photographed. Scope In: 11:08:56 AM Scope Out: 11:34:17 AM Scope Withdrawal Time: 0 hours 21 minutes 34 seconds  Total Procedure Duration: 0 hours 25 minutes 21 seconds  Findings:                 The perianal and digital rectal examinations were                            normal.                           A 3 mm polyp was found in the cecum. The polyp was                            sessile. The polyp was removed with a cold snare.                            Resection and retrieval were complete.                           A 3 mm polyp was found in the transverse colon. The  polyp was sessile. The polyp was removed with a                            cold snare. Resection and retrieval were complete.                           A 3 mm polyp was found in the descending colon. The                            polyp was sessile. The polyp was removed with a                            cold snare. Resection and retrieval were complete.                           Scattered small-mouthed diverticula were found in                            the transverse colon and left colon.                           Internal hemorrhoids were found during                            retroflexion. The hemorrhoids were small.                           The exam was otherwise without abnormality. Of                            note, localized retained vegetable matter was                            retained in the cecum, this was removed with a Brandon Kemp                             net to clear the cecum. Complications:            No immediate complications. Estimated blood loss:                            Minimal. Estimated Blood Loss:     Estimated blood loss was minimal. Impression:               - One 3 mm polyp in the cecum, removed with a cold                            snare. Resected and retrieved.                           - One 3 mm polyp in the transverse colon, removed                            with a cold snare. Resected and retrieved.                           -  One 3 mm polyp in the descending colon, removed                            with a cold snare. Resected and retrieved.                           - Diverticulosis in the transverse colon and in the                            left colon.                           - Internal hemorrhoids.                           - The examination was otherwise normal. Recommendation:           - Patient has a contact number available for                            emergencies. The signs and symptoms of potential                            delayed complications were discussed with the                            patient. Return to normal activities tomorrow.                            Written discharge instructions were provided to the                            patient.                           - Resume previous diet.                           - Continue present medications.                           - Await pathology results. Viviann Spare P. Jaleigh Mccroskey, MD 11/15/2022 11:39:03 AM This report has been signed electronically.

## 2022-11-15 NOTE — Progress Notes (Signed)
Sedate, gd SR, tolerated procedure well, VSS, report to RN

## 2022-11-15 NOTE — Progress Notes (Signed)
Called to room to assist during endoscopic procedure.  Patient ID and intended procedure confirmed with present staff. Received instructions for my participation in the procedure from the performing physician.  

## 2022-11-15 NOTE — Patient Instructions (Signed)
Resume all of your previous medications as ordered.  Read the handouts given to you by your recovery room nurse.  YOU HAD AN ENDOSCOPIC PROCEDURE TODAY AT THE [facility]:   Refer to the procedure report that was given to you for any specific questions about what was found during the examination.  If the procedure report does not answer your questions, please call your gastroenterologist to clarify.  If you requested that your care partner not be given the details of your procedure findings, then the procedure report has been included in a sealed envelope for you to review at your convenience later.  YOU SHOULD EXPECT: Some feelings of bloating in the abdomen. Passage of more gas than usual.  Walking can help get rid of the air that was put into your GI tract during the procedure and reduce the bloating. If you had a lower endoscopy (such as a colonoscopy or flexible sigmoidoscopy) you may notice spotting of blood in your stool or on the toilet paper. If you underwent a bowel prep for your procedure, you may not have a normal bowel movement for a few days.  Please Note:  You might notice some irritation and congestion in your nose or some drainage.  This is from the oxygen used during your procedure.  There is no need for concern and it should clear up in a day or so.  SYMPTOMS TO REPORT IMMEDIATELY:  Following lower endoscopy (colonoscopy or flexible sigmoidoscopy):  Excessive amounts of blood in the stool  Significant tenderness or worsening of abdominal pains  Swelling of the abdomen that is new, acute  Fever of 100F or higher  For urgent or emergent issues, a gastroenterologist can be reached at any hour by calling [phone]. Do not use MyChart messaging for urgent concerns.    DIET:  We do recommend a small meal at first, but then you may proceed to your regular diet.  Drink plenty of fluids but you should avoid alcoholic beverages for 24 hours.  ACTIVITY:  You should  plan to take it easy for the rest of today and you should NOT DRIVE or use heavy machinery until tomorrow (because of the sedation medicines used during the test).    FOLLOW UP: Our staff will call the number listed on your records the next business day following your procedure.  We will call around 7:15- 8:00 am to check on you and address any questions or concerns that you may have regarding the information given to you following your procedure. If we do not reach you, we will leave a message.     If any biopsies were taken you will be contacted by phone or by letter within the next 1-3 weeks.  Please call us at [phone] if you have not heard about the biopsies in 3 weeks.    SIGNATURES/CONFIDENTIALITY: You and/or your care partner have signed paperwork which will be entered into your electronic medical record.  These signatures attest to the fact that that the information above on your After Visit Summary has been reviewed and is understood.  Full responsibility of the confidentiality of this discharge information lies with you and/or your care-partner.

## 2022-11-15 NOTE — Progress Notes (Signed)
Pt's states no medical or surgical changes since previsit or office visit. 

## 2022-11-15 NOTE — Progress Notes (Signed)
Brandon Kemp [facility] History and Physical   Primary Care Physician:  Brandon Beams, MD   Reason for Procedure:   Colon cancer screening  Plan:    colonoscopy     HPI: Brandon Kemp is a 47 y.o. male  here for colonoscopy screening  - first time exam.   Patient denies any bowel symptoms at this time. No family history of colon cancer known. Otherwise feels well without any cardiopulmonary symptoms.   I have discussed risks / benefits of anesthesia and endoscopic procedure with Brandon Kemp and they wish to proceed with the exams as outlined today.    Past Medical History:  Diagnosis Date   Anxiety    CAD (coronary artery disease)    Congenital pulmonic valve stenosis    a. s/p surgical repair @ Age 22 in [place];  Kemp.  12/12  Echo: EF 55-60%, nl wall motion, mild LVH, moderate PR.   Depression    Heart murmur    History of tobacco abuse    Hypertension    Ischemic cardiomyopathy    Relationship problem between partners 12/07/2017   Symptomatic PVCs    a. 09/2011    Past Surgical History:  Procedure Laterality Date   ANKLE SURGERY     CORONARY STENT INTERVENTION N/A 09/26/2021   Procedure: CORONARY STENT INTERVENTION;  Surgeon: Brandon Bollman, MD;  Location: [facility] INVASIVE CV LAB;  Service: Cardiovascular;  Laterality: N/A;   CORONARY ULTRASOUND/IVUS N/A 09/26/2021   Procedure: Intravascular Ultrasound/IVUS;  Surgeon: Brandon Bollman, MD;  Location: [facility] INVASIVE CV LAB;  Service: Cardiovascular;  Laterality: N/A;   HAND SURGERY     INGUINAL HERNIA REPAIR     LEFT HEART CATH AND CORONARY ANGIOGRAPHY N/A 09/26/2021   Procedure: LEFT HEART CATH AND CORONARY ANGIOGRAPHY;  Surgeon: Brandon Bollman, MD;  Location: [facility] INVASIVE CV LAB;  Service: Cardiovascular;  Laterality: N/A;   pulmonary outflow patch  age 42   pulmonary valvotomy  age 42    Prior to Admission medications   Medication Sig Start Date End Date Taking? Authorizing Provider  aspirin EC 81 MG tablet  Take 81 mg by mouth daily. Swallow whole.   Yes [provider]  ezetimibe (ZETIA) 10 MG tablet Take 1 tablet (10 mg total) by mouth daily. 03/13/22  Yes Brandon Bollman, MD  Magnesium 500 MG TABS Take 500 mg by mouth daily.   Yes [provider]  methylphenidate (RITALIN LA) 20 MG 24 hr capsule Take 20 mg by mouth daily. 10/27/22  Yes [provider]  rosuvastatin (CRESTOR) 40 MG tablet Take 1 tablet (40 mg total) by mouth daily. 03/13/22  Yes Brandon Bollman, MD  telmisartan (MICARDIS) 40 MG tablet TAKE 1 TABLET BY MOUTH EVERY DAY 08/07/22  Yes Brandon Bollman, MD  Coenzyme Q10 100 MG TABS Take 2 tablets (200 mg total) by mouth daily. Patient not taking: Reported on 11/08/2022 10/05/21   Brandon Montana, PA-C  nitroGLYCERIN (NITROSTAT) 0.4 MG SL tablet Place 1 tablet (0.4 mg total) under the tongue every 5 (five) minutes as needed. Patient not taking: Reported on 11/08/2022 09/26/21   Brandon Page B, NP  sildenafil (REVATIO) 20 MG tablet Take 20 mg by mouth daily as needed (ED). 03/09/21   [provider]    Current Outpatient Medications  Medication Sig Dispense Refill   aspirin EC 81 MG tablet Take 81 mg by mouth daily. Swallow whole.     ezetimibe (ZETIA) 10 MG tablet Take 1 tablet (10  mg total) by mouth daily. 90 tablet 3   Magnesium 500 MG TABS Take 500 mg by mouth daily.     methylphenidate (RITALIN LA) 20 MG 24 hr capsule Take 20 mg by mouth daily.     rosuvastatin (CRESTOR) 40 MG tablet Take 1 tablet (40 mg total) by mouth daily. 90 tablet 3   telmisartan (MICARDIS) 40 MG tablet TAKE 1 TABLET BY MOUTH EVERY DAY 90 tablet 3   Coenzyme Q10 100 MG TABS Take 2 tablets (200 mg total) by mouth daily. (Patient not taking: Reported on 11/08/2022) 180 tablet 2   nitroGLYCERIN (NITROSTAT) 0.4 MG SL tablet Place 1 tablet (0.4 mg total) under the tongue every 5 (five) minutes as needed. (Patient not taking: Reported on 11/08/2022) 25 tablet 2   sildenafil (REVATIO) 20 MG  tablet Take 20 mg by mouth daily as needed (ED).     Current Facility-Administered Medications  Medication Dose Route Frequency Provider Last Rate Last Admin   0.9 %  sodium chloride infusion  500 mL Intravenous Once Brandon Kemp, [name], MD        Allergies as of 11/15/2022   (No Known Allergies)    Family History  Problem Relation Age of Onset   Hypertension Father    Evelene Croon Parkinson White syndrome Brother    Colon cancer Neg Hx    Colon polyps Neg Hx    Esophageal cancer Neg Hx    Rectal cancer Neg Hx    Stomach cancer Neg Hx     Social History   Socioeconomic History   Marital status: Married    Spouse name: Not on file   Number of children: Not on file   Years of education: Not on file   Highest education level: Bachelor's degree (e.g., BA, AB, BS)  Occupational History   Not on file  Tobacco Use   Smoking status: Former   Smokeless tobacco: Never   Tobacco comments:    sometimes, when stressed about 2 cigarettes a day  Vaping Use   Vaping status: Never Used  Substance and Sexual Activity   Alcohol use: No    Comment: social   Drug use: No   Sexual activity: Not Currently    Birth control/protection: Abstinence  Other Topics Concern   Not on file  Social History Narrative   Not on file   Social Determinants of Health   Financial Resource Strain: Not on file  Food Insecurity: Not on file  Transportation Needs: Not on file  Physical Activity: Not on file  Stress: Not on file  Social Connections: Not on file  Intimate Partner Violence: Not on file    Review of Systems: All other review of systems negative except as mentioned in the HPI.  Physical Exam: Vital signs BP (!) 159/100   Pulse 90   Temp 98.9 F (37.2 C) (Skin)   Ht 5\' 11"  (1.803 m)   Wt 240 lb (108.9 kg)   SpO2 98%   BMI 33.47 kg/m   General:   Alert,  Well-developed, pleasant and cooperative in NAD Lungs:  Clear throughout to auscultation.   Heart:  Regular rate and  rhythm Abdomen:  Soft, nontender and nondistended.   Neuro/Psych:  Alert and cooperative. Normal mood and affect. A and O x 3  Brandon Rain, MD [facility]

## 2022-11-18 ENCOUNTER — Telehealth: Payer: Self-pay

## 2022-11-18 NOTE — Telephone Encounter (Signed)
  Follow up Call-     11/15/2022   10:33 AM  Call back number  Post procedure Call Back phone  # [phone]  Permission to leave phone message Yes     Patient questions:  Do you have a fever, pain , or abdominal swelling? No. Pain Score  0 *  Have you tolerated food without any problems? Yes.    Have you been able to return to your normal activities? Yes.    Do you have any questions about your discharge instructions: Diet   No. Medications  No. Follow up visit  No.  Do you have questions or concerns about your Care? No.  Actions: * If pain score is 4 or above: No action needed, pain <4.

## 2022-11-26 ENCOUNTER — Encounter: Payer: Self-pay | Admitting: Gastroenterology

## 2022-11-26 ENCOUNTER — Encounter: Payer: BC Managed Care – PPO | Admitting: Gastroenterology

## 2022-12-02 ENCOUNTER — Other Ambulatory Visit: Payer: Self-pay | Admitting: Cardiovascular Disease

## 2022-12-02 DIAGNOSIS — R931 Abnormal findings on diagnostic imaging of heart and coronary circulation: Secondary | ICD-10-CM

## 2022-12-02 DIAGNOSIS — I10 Essential (primary) hypertension: Secondary | ICD-10-CM

## 2022-12-02 DIAGNOSIS — R9431 Abnormal electrocardiogram [ECG] [EKG]: Secondary | ICD-10-CM

## 2022-12-02 DIAGNOSIS — Q221 Congenital pulmonary valve stenosis: Secondary | ICD-10-CM

## 2022-12-17 ENCOUNTER — Ambulatory Visit: Payer: PRIVATE HEALTH INSURANCE | Attending: Cardiovascular Disease

## 2022-12-17 DIAGNOSIS — R7401 Elevation of levels of liver transaminase levels: Secondary | ICD-10-CM

## 2022-12-18 LAB — ALT: ALT: 45 [IU]/L — ABNORMAL HIGH (ref 0–44)

## 2022-12-20 ENCOUNTER — Telehealth: Payer: Self-pay

## 2022-12-20 DIAGNOSIS — Z79899 Other long term (current) drug therapy: Secondary | ICD-10-CM

## 2022-12-20 DIAGNOSIS — R7401 Elevation of levels of liver transaminase levels: Secondary | ICD-10-CM

## 2022-12-20 NOTE — Telephone Encounter (Signed)
Called patient and left detailed message per DPR. Repeat labs ordered and scheduled for 06/20/23.

## 2022-12-20 NOTE — Telephone Encounter (Signed)
-----   Message from [name] sent at 12/18/2022  9:23 AM EDT ----- Stable findings. Repeat lipids/LFT's in 6 months. Continue current Rx. thanks

## 2022-12-25 DIAGNOSIS — I2510 Atherosclerotic heart disease of native coronary artery without angina pectoris: Secondary | ICD-10-CM | POA: Diagnosis not present

## 2022-12-25 DIAGNOSIS — I10 Essential (primary) hypertension: Secondary | ICD-10-CM | POA: Insufficient documentation

## 2022-12-25 DIAGNOSIS — Z79899 Other long term (current) drug therapy: Secondary | ICD-10-CM | POA: Insufficient documentation

## 2022-12-25 DIAGNOSIS — Z7982 Long term (current) use of aspirin: Secondary | ICD-10-CM | POA: Diagnosis not present

## 2022-12-25 DIAGNOSIS — R072 Precordial pain: Secondary | ICD-10-CM | POA: Insufficient documentation

## 2022-12-26 ENCOUNTER — Encounter (HOSPITAL_COMMUNITY): Payer: Self-pay | Admitting: *Deleted

## 2022-12-26 ENCOUNTER — Other Ambulatory Visit: Payer: Self-pay

## 2022-12-26 ENCOUNTER — Emergency Department (HOSPITAL_COMMUNITY)
Admission: EM | Admit: 2022-12-26 | Discharge: 2022-12-26 | Disposition: A | Discharge status: Home / Self Care | Payer: PRIVATE HEALTH INSURANCE | Attending: Emergency Medicine | Admitting: Emergency Medicine

## 2022-12-26 ENCOUNTER — Emergency Department (HOSPITAL_COMMUNITY): Payer: PRIVATE HEALTH INSURANCE

## 2022-12-26 DIAGNOSIS — R072 Precordial pain: Secondary | ICD-10-CM

## 2022-12-26 LAB — CBC
HCT: 46.8 % (ref 39.0–52.0)
Hemoglobin: 16.1 g/dL (ref 13.0–17.0)
MCH: 30.3 pg (ref 26.0–34.0)
MCHC: 34.4 g/dL (ref 30.0–36.0)
MCV: 88.1 fL (ref 80.0–100.0)
Platelets: 239 10*3/uL (ref 150–400)
RBC: 5.31 MIL/uL (ref 4.22–5.81)
RDW: 11.9 % (ref 11.5–15.5)
WBC: 6.9 10*3/uL (ref 4.0–10.5)
nRBC: 0 % (ref 0.0–0.2)

## 2022-12-26 LAB — BASIC METABOLIC PANEL
Anion gap: 12 (ref 5–15)
BUN: 11 mg/dL (ref 6–20)
CO2: 21 mmol/L — ABNORMAL LOW (ref 22–32)
Calcium: 9.4 mg/dL (ref 8.9–10.3)
Chloride: 103 mmol/L (ref 98–111)
Creatinine, Ser: 1 mg/dL (ref 0.61–1.24)
GFR, Estimated: >60 mL/min (ref >60)
Glucose, Bld: 106 mg/dL — ABNORMAL HIGH (ref 70–99)
Potassium: 3.9 mmol/L (ref 3.5–5.1)
Sodium: 136 mmol/L (ref 135–145)

## 2022-12-26 LAB — TROPONIN I (HIGH SENSITIVITY)
Troponin I (High Sensitivity): 16 ng/L (ref <18)
Troponin I (High Sensitivity): 19 ng/L — ABNORMAL HIGH (ref <18)

## 2022-12-26 MED ORDER — ASPIRIN 81 MG PO CHEW
324.0000 mg | CHEWABLE_TABLET | Freq: Once | ORAL | Status: AC
  Administered 2022-12-26: 324 mg via ORAL
  Filled 2022-12-26: qty 4

## 2022-12-26 NOTE — Discharge Instructions (Signed)
You are seen today for chest pain.  Your workup today is largely reassuring.  He had a very minimal elevation in one of your heart tests but overall your EKG and other workup is reassuring.  It is very important that you follow-up closely with your cardiologist.  If you have recurrence or worsening of symptoms, you should be reevaluated in the emergency department immediately.

## 2022-12-26 NOTE — ED Notes (Signed)
Pt ambulated up and down hallway multiple times, states he feels no cp with exertion

## 2022-12-26 NOTE — ED Triage Notes (Signed)
Not feeling well for 2-3 days with rib pressure  he had a mi one year ago and has a stent   nausea  he  he has taken he took 162 mg of aspirin  tonight  he has also felt tightness in his throat

## 2022-12-26 NOTE — ED Provider Notes (Signed)
[facility] AT [facility] Provider Note   CSN: [id number] Arrival date & time: 12/25/22  2354     History  Chief Complaint  Patient presents with   Chest Pain    Brandon Kemp is a 47 y.o. male.  HPI     This is a 47 year old male who presents with not feeling right and chest pain.  Patient with history of coronary artery disease status post stent placement in LAD.  Patient reports that he over the last several days has just felt fatigued and has not felt very well.  He laid down tonight and began to have chest pain and some tightness in his throat.  It was self-limited and self resolved.  It was nonexertional.  When he had his LAD stent placed, he was not having active chest pain so this is not necessarily similar.  Reports recent stress testing that was reassuring.  He is no longer on anticoagulation.  Denies shortness of breath, fever, or recent illness.  Home Medications Prior to Admission medications   Medication Sig Start Date End Date Taking? Authorizing Provider  aspirin EC 81 MG tablet Take 81 mg by mouth daily. Swallow whole.   Yes [provider]  CALCIUM-VITAMIN D PO Take 1 tablet by mouth daily.   Yes [provider]  Coenzyme Q10 100 MG TABS Take 2 tablets (200 mg total) by mouth daily. Patient taking differently: Take 100 mg by mouth daily. 10/05/21  Yes Dunn, Dayna N, PA-C  ezetimibe (ZETIA) 10 MG tablet Take 1 tablet (10 mg total) by mouth daily. 03/13/22  Yes [name], MD  L-Arginine 500 MG CAPS Take 500 mg by mouth daily.   Yes [provider]  Magnesium 500 MG TABS Take 500 mg by mouth daily.   Yes [provider]  methylphenidate (RITALIN LA) 20 MG 24 hr capsule Take 20 mg by mouth daily. 10/27/22  Yes [provider]  Multiple Vitamins-Minerals (MULTIVITAMIN WITH MINERALS) tablet Take 1 tablet by mouth daily.   Yes [provider]  nitroGLYCERIN (NITROSTAT) 0.4 MG SL  tablet Place 1 tablet (0.4 mg total) under the tongue every 5 (five) minutes as needed. 09/26/21  Yes Laverda Page B, NP  rosuvastatin (CRESTOR) 40 MG tablet Take 1 tablet (40 mg total) by mouth daily. 03/13/22  Yes [name], MD  sildenafil (REVATIO) 20 MG tablet Take 20 mg by mouth daily as needed (ED). 03/09/21  Yes [provider]  telmisartan (MICARDIS) 40 MG tablet TAKE 1 TABLET BY MOUTH EVERY DAY 08/07/22  Yes [name], MD      Allergies    Patient has no known allergies.    Review of Systems   Review of Systems  Constitutional:  Positive for fatigue. Negative for fever.  Respiratory:  Negative for shortness of breath.   Cardiovascular:  Positive for chest pain. Negative for leg swelling.  Gastrointestinal:  Negative for abdominal pain, nausea and vomiting.  All other systems reviewed and are negative.   Physical Exam Updated Vital Signs BP 116/80   Pulse 65   Temp 98.3 F (36.8 C)   Resp 16   Ht 1.803 m (5\' 11" )   Wt 108.9 kg   SpO2 96%   BMI 33.48 kg/m  Physical Exam Vitals and nursing note reviewed.  Constitutional:      Appearance: He is well-developed. He is not ill-appearing.  HENT:     Head: Normocephalic and atraumatic.  Eyes:  Pupils: Pupils are equal, round, and reactive to light.  Cardiovascular:     Rate and Rhythm: Normal rate and regular rhythm.     Heart sounds: Normal heart sounds. No murmur heard. Pulmonary:     Effort: Pulmonary effort is normal. No respiratory distress.     Breath sounds: Normal breath sounds. No wheezing.  Abdominal:     General: Bowel sounds are normal.     Palpations: Abdomen is soft.     Tenderness: There is no abdominal tenderness. There is no rebound.  Musculoskeletal:     Cervical back: Neck supple.  Lymphadenopathy:     Cervical: No cervical adenopathy.  Skin:    General: Skin is warm and dry.  Neurological:     General: No focal deficit present.     Mental Status: He is alert and  oriented to person, place, and time.     ED Results / Procedures / Treatments   Labs (all labs ordered are listed, but only abnormal results are displayed) Labs Reviewed  BASIC METABOLIC PANEL - Abnormal; Notable for the following components:      Result Value   CO2 21 (*)    Glucose, Bld 106 (*)    All other components within normal limits  TROPONIN I (HIGH SENSITIVITY) - Abnormal; Notable for the following components:   Troponin I (High Sensitivity) 19 (*)    All other components within normal limits  CBC  TROPONIN I (HIGH SENSITIVITY)    EKG EKG Interpretation Date/Time:  Wednesday December 25 2022 23:50:17 EDT Ventricular Rate:  77 PR Interval:  160 QRS Duration:  90 QT Interval:  362 QTC Calculation: 409 R Axis:   75  Text Interpretation: Normal sinus rhythm Normal ECG When compared with ECG of 09-Sep-2022 09:24, PREVIOUS ECG IS PRESENT Confirmed by Ross Marcus ([id number]) on 12/26/2022 12:57:22 AM  Radiology DG Chest Port 1 View  Result Date: 12/26/2022 CLINICAL DATA:  Chest pain and pressure for several days, initial encounter EXAM: PORTABLE CHEST 1 VIEW COMPARISON:  04/22/2012 FINDINGS: The heart size and mediastinal contours are within normal limits. Both lungs are clear. The visualized skeletal structures are unremarkable. IMPRESSION: No active disease. Electronically Signed   By: Alcide Clever M.D.   On: 12/26/2022 01:25    Procedures Procedures    Medications Ordered in ED Medications  aspirin chewable tablet 324 mg (324 mg Oral Given 12/26/22 0329)    ED Course/ Medical Decision Making/ A&P Clinical Course as of 12/26/22 0358  Thu Dec 26, 2022  [id number] Discussed with cardiology, Elayne Guerin.  Very minimal bump in troponin to 19.  Patient has been symptom-free in the emergency department.  Reviewed EKG and workup as well as prior catheterizations.  Felt to be quite low risk.  Patient is very reliable.  He feels comfortable following up with Dr. Excell Seltzer.  He  ambulated about the department without any recurrence of chest discomfort. [CH]    Clinical Course User Index [CH] Ahsan Esterline, Mayer Masker, MD                                 Medical Decision Making Risk OTC drugs.   This patient presents to the ED for concern of chest pain, this involves an extensive number of treatment options, and is a complaint that carries with it a high risk of complications and morbidity.  I considered the following differential and admission for this acute, potentially life  threatening condition.  The differential diagnosis includes ACS, PE, pneumothorax, pneumonia, reflux  MDM:    This is a 47 year old male with history of coronary artery disease and ischemic cardiomyopathy who presents with a episode of chest pain tonight.  Fairly atypical and nonexertional.  Vital signs are reassuring.  His pain is fairly atypical.  Prior stent was after a diagnostic catheterization when he did not have pain.  He is currently pain-free.  EKG is normal without ischemic arrhythmic changes.  Chest x-ray without pneumothorax or pneumonia.  Troponin initially 16.  Is slightly increased to 19.  He has remained asymptomatic.  Discussion with cardiology as above.  Patient is comfortable following up with Dr. Excell Seltzer.  He certainly has risk factors but with atypical story, normal EKG and very minimally elevated troponin, he we will follow-up closely as an outpatient.  He was given strict return precautions.  (Labs, imaging, consults)  Labs: I Ordered, and personally interpreted labs.  The pertinent results include: CBC, BMP, troponin x 2  Imaging Studies ordered: I ordered imaging studies including chest x-ray I independently visualized and interpreted imaging. I agree with the radiologist interpretation  Additional history obtained from chart review.  External records from outside source obtained and reviewed including allergy and cath results  Cardiac Monitoring: The patient was  maintained on a cardiac monitor.  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  12/12  Echo: EF 55-60%, nl wall motion, mild LVH, moderate PR.   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

## 2023-01-03 ENCOUNTER — Encounter: Payer: Self-pay | Admitting: Cardiovascular Disease

## 2023-01-03 ENCOUNTER — Ambulatory Visit: Payer: PRIVATE HEALTH INSURANCE | Attending: Cardiovascular Disease | Admitting: Cardiovascular Disease

## 2023-01-03 VITALS — BP 132/84 | HR 83 | Ht 71.0 in | Wt 248.6 lb

## 2023-01-03 DIAGNOSIS — I25119 Atherosclerotic heart disease of native coronary artery with unspecified angina pectoris: Secondary | ICD-10-CM | POA: Diagnosis not present

## 2023-01-03 DIAGNOSIS — I10 Essential (primary) hypertension: Secondary | ICD-10-CM | POA: Diagnosis not present

## 2023-01-03 DIAGNOSIS — E785 Hyperlipidemia, unspecified: Secondary | ICD-10-CM

## 2023-01-03 DIAGNOSIS — Q221 Congenital pulmonary valve stenosis: Secondary | ICD-10-CM

## 2023-01-03 NOTE — Progress Notes (Unsigned)
Cardiology Office Note:    Date:  01/07/2023   ID:  Brandon Kemp, DOB 30-Apr-1975, MRN [id number]  PCP:  Aliene Beams, MD   [facility] Providers Cardiologist:  [name], MD     Referring MD: Aliene Beams, MD   Chief Complaint  Patient presents with   Coronary Artery Disease    History of Present Illness:    Brandon Kemp is a 47 y.o. male presenting for follow-up of coronary artery disease.  The patient was recently evaluated for chest pain in the emergency department where he had minimal troponin elevation.  He was felt to be stable for outpatient follow-up.  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  Cardiovascular problems include hypertension, mixed hyperlipidemia, and coronary artery disease with total occlusion of the proximal LAD treated with drug-eluting stent implantation in July 2023.  LVEF assessed by echo was 45 to 50% initially, improved to 50 to 55% on follow-up echo.  A Myoview scan in July showed normal perfusion and normal LVEF 55 to 65%.  The patient is here alone today.  He had an episode of chest pain where he described central and left-sided discomfort feeling like a pressure.  This occurred at rest.  He was evaluated in the emergency department where he had minimal troponin elevation from high-sensitivity troponin of 16 up to 19 and a nonischemic EKG.  He was reassured and arranged for close outpatient cardiology follow-up.  He has been able to exert himself with no chest discomfort associated with exertion.  He can walk briskly and even push himself physically without limitation.  He denies shortness of breath, lightheadedness, or heart palpitations.  He wonders if some of his chest discomfort may have been stress related.   Current Medications: Current Meds  Medication Sig   aspirin EC 81 MG tablet Take 81 mg by mouth daily. Swallow whole.    CALCIUM-VITAMIN D PO Take 1 tablet by mouth daily.   Coenzyme Q10 100 MG TABS Take 2 tablets (200 mg total) by mouth daily. (Patient taking differently: Take 100 mg by mouth daily.)   ezetimibe (ZETIA) 10 MG tablet Take 1 tablet (10 mg total) by mouth daily.   L-Arginine 500 MG CAPS Take 500 mg by mouth daily.   Magnesium 500 MG TABS Take 500 mg by mouth daily.   methylphenidate (RITALIN LA) 20 MG 24 hr capsule Take 20 mg by mouth daily.   Multiple Vitamins-Minerals (MULTIVITAMIN WITH MINERALS) tablet Take 1 tablet by mouth daily.   nitroGLYCERIN (NITROSTAT) 0.4 MG SL tablet Place 1 tablet (0.4 mg total) under the tongue every 5 (five) minutes as needed.   rosuvastatin (CRESTOR) 40 MG tablet Take 1 tablet (40 mg total) by mouth daily.   sildenafil (REVATIO) 20 MG tablet Take 20 mg by mouth daily as needed (ED).   telmisartan (MICARDIS) 40 MG tablet TAKE 1 TABLET BY MOUTH EVERY DAY     Allergies:   Patient has no known allergies.   ROS:   Please see the history of present illness.    All other systems reviewed and are negative.  EKGs/Labs/Other Studies Reviewed:    The following studies were reviewed today: Cardiac Studies & Procedures   CARDIAC CATHETERIZATION  CARDIAC CATHETERIZATION 09/26/2021  Narrative 1.  Severe single-vessel coronary artery disease with subtotal stenosis of the proximal LAD, successfully treated with PCI using IVUS guidance (3.5 x 34 mm  Onyx frontier DES) 2.  Patent left main, left circumflex, and dominant RCA with mild diffuse plaquing but no significant stenoses  Recommend: Same-day PCI discharge protocol if criteria met.  Dual antiplatelet therapy with aspirin and ticagrelor x12 months  Findings Coronary Findings Diagnostic  Dominance: Right  Left Main The left main is patent with no stenosis.  The vessel bifurcates into the LAD and left circumflex.  Left Anterior Descending Collaterals Dist LAD filled by collaterals from RPDA.  Prox LAD to Mid LAD  lesion is 99% stenosed. The lesion is calcified.  First Septal Branch Collaterals 1st Sept filled by collaterals from RPDA.  Second Septal Branch Collaterals 2nd Sept filled by collaterals from RPDA.  Left Circumflex The circumflex is patent.  The vessel has mild diffuse plaquing without any high-grade stenosis.  The first OM branch is large in caliber and divides into twin vessels.  The AV circumflex is of medium caliber and supplies 2 small posterolateral branches.  First Obtuse Marginal Branch 1st Mrg lesion is 30% stenosed.  Right Coronary Artery Vessel is large. There is mild diffuse disease throughout the vessel. There is mild diffuse plaquing throughout the RCA.  The vessel is large, dominant, without significant stenosis.  The PDA and PLA branches are patent.  The LAD fills via right to left collaterals supplied through septal perforating branches  Intervention  Prox LAD to Mid LAD lesion Stent CATH VISTA GUIDE 6FR XBLAD3.5 guide catheter was inserted. Lesion crossed with guidewire using a WIRE COUGAR XT STRL 190CM. Pre-stent angioplasty was performed using a BALLN SAPPHIRE 2.5X15. Maximum pressure:  14 atm. A drug-eluting stent was successfully placed using a STENT ONYX FRONTIER 3.5X34. Post-stent angioplasty was performed using a BALL SAPPHIRE NC24 3.75X15. Maximum pressure:  18 atm. Post-Intervention Lesion Assessment The intervention was successful. Pre-interventional TIMI flow is 1. Post-intervention TIMI flow is 3. No complications occurred at this lesion. There is a 0% residual stenosis post intervention.   STRESS TESTS  MYOCARDIAL PERFUSION IMAGING 09/13/2022  Interpretation Summary   The study is normal. The study is low risk.   No ST deviation was noted.   Left ventricular function is normal. The left ventricular ejection fraction is normal (55-65%). End diastolic cavity size is normal.   Prior study not available for comparison.    ECHOCARDIOGRAM  ECHOCARDIOGRAM COMPLETE 02/20/2022  Narrative ECHOCARDIOGRAM REPORT    [name] Date of Exam: 02/20/2022 Medical Rec #:  [id number]         Height:       71.0 in Accession #:    [id number]        Weight:       240.2 lb Date of Birth:  [birth year]         BSA:          2.279 m Patient Age:    46 years          BP:           132/80 mmHg Patient Gender: M                 HR:           66 bpm. Exam Location:  [address]  Procedure: 2D Echo, Cardiac Doppler, Color Doppler and Strain Analysis  Indications:    I25.5 Ischemic cardiomyopathy  History:        Patient has prior history of Echocardiogram examinations, most recent 08/30/2021. CAD, Ischemic cardiomyopathy; Risk Factors:Hypertension, Dyslipidemia and Former  Smoker.  Sonographer:    Samule Ohm RDCS Referring Phys: [phone] Adelin Ventrella  IMPRESSIONS   1. Left ventricular ejection fraction, by estimation, is 50 to 55%. The left ventricle has low normal function. The left ventricle has no regional wall motion abnormalities. Left ventricular diastolic parameters were normal. The average left ventricular global longitudinal strain is -12.9 %. The global longitudinal strain is abnormal. 2. Right ventricular systolic function is normal. The right ventricular size is normal. 3. The mitral valve is normal in structure. No evidence of mitral valve regurgitation. No evidence of mitral stenosis. 4. The aortic valve is tricuspid. There is mild thickening of the aortic valve. Aortic valve regurgitation is not visualized. Aortic valve sclerosis is present, with no evidence of aortic valve stenosis. 5. The inferior vena cava is normal in size with greater than 50% respiratory variability, suggesting right atrial pressure of 3 mmHg.  FINDINGS Left Ventricle: Left ventricular ejection fraction, by estimation, is 50 to 55%. The left ventricle has low normal function. The left ventricle has no  regional wall motion abnormalities. The average left ventricular global longitudinal strain is -12.9 %. The global longitudinal strain is abnormal. The left ventricular internal cavity size was normal in size. There is no left ventricular hypertrophy. Left ventricular diastolic parameters were normal. Normal left ventricular filling pressure.  Right Ventricle: The right ventricular size is normal. No increase in right ventricular wall thickness. Right ventricular systolic function is normal.  Left Atrium: Left atrial size was normal in size.  Right Atrium: Right atrial size was normal in size.  Pericardium: There is no evidence of pericardial effusion.  Mitral Valve: The mitral valve is normal in structure. No evidence of mitral valve regurgitation. No evidence of mitral valve stenosis.  Tricuspid Valve: The tricuspid valve is normal in structure. Tricuspid valve regurgitation is not demonstrated. No evidence of tricuspid stenosis.  Aortic Valve: The aortic valve is tricuspid. There is mild thickening of the aortic valve. Aortic valve regurgitation is not visualized. Aortic valve sclerosis is present, with no evidence of aortic valve stenosis.  Pulmonic Valve: The pulmonic valve was not well visualized. Pulmonic valve regurgitation is mild. No evidence of pulmonic stenosis.  Aorta: The aortic root is normal in size and structure.  Venous: The inferior vena cava is normal in size with greater than 50% respiratory variability, suggesting right atrial pressure of 3 mmHg.  IAS/Shunts: No atrial level shunt detected by color flow Doppler.   LEFT VENTRICLE PLAX 2D LVIDd:         4.60 cm   Diastology LVIDs:         3.45 cm   LV e' medial:    10.10 cm/s LV PW:         1.10 cm   LV E/e' medial:  7.4 LV IVS:        1.10 cm   LV e' lateral:   11.10 cm/s LVOT diam:     2.00 cm   LV E/e' lateral: 6.7 LV SV:         72 LV SV Index:   32        2D Longitudinal Strain LVOT Area:     3.14 cm  2D  Strain GLS Avg:     -12.9 %   RIGHT VENTRICLE            IVC TAPSE (M-mode): 1.6 cm     IVC diam: 0.90 cm RVSP:  20.0 mmHg  LEFT ATRIUM             Index        RIGHT ATRIUM           Index LA diam:        3.30 cm 1.45 cm/m   RA Pressure: 3.00 mmHg LA Vol (A2C):   41.4 ml 18.16 ml/m  RA Area:     18.50 cm LA Vol (A4C):   51.4 ml 22.55 ml/m  RA Volume:   55.60 ml  24.39 ml/m LA Biplane Vol: 48.4 ml 21.24 ml/m AORTIC VALVE LVOT Vmax:   117.00 cm/s LVOT Vmean:  74.600 cm/s LVOT VTI:    0.230 m  AORTA Ao Root diam: 3.30 cm Ao Asc diam:  3.10 cm  MITRAL VALVE               TRICUSPID VALVE MV Area (PHT): 3.83 cm    TR Peak grad:   17.0 mmHg MV Decel Time: 198 msec    TR Vmax:        206.00 cm/s MV E velocity: 74.40 cm/s  Estimated RAP:  3.00 mmHg MV A velocity: 49.80 cm/s  RVSP:           20.0 mmHg MV E/A ratio:  1.49 SHUNTS Systemic VTI:  0.23 m Systemic Diam: 2.00 cm  Rachelle Hora Croitoru MD Electronically signed by Thurmon Fair MD Signature Date/Time: 02/20/2022/12:33:14 PM    Final     CT SCANS  CT CARDIAC SCORING (SELF PAY ONLY) 08/30/2021  Addendum 08/30/2021 11:56 AM ADDENDUM REPORT: 08/30/2021 10:24  EXAM: OVER-READ INTERPRETATION  CT CHEST  The following report is an over-read performed by radiologist Dr. Darlys Gales [facility] on 08/30/2021. This over-read does not include interpretation of cardiac or coronary anatomy or pathology. The coronary calcium score interpretation by the cardiologist is attached.  COMPARISON:  None.  FINDINGS: Vascular: No significant extracardiac vascular findings. Unchanged calcification of the pericardium overlying the right ventricular outflow tract.  Mediastinum/Nodes: No lymphadenopathy.  Lungs/Pleura: No focal airspace disease. No suspicious pulmonary nodules.  Upper Abdomen: No acute abnormality.  Musculoskeletal: No acute osseous abnormality. No suspicious osseous lesion. Prior median  sternotomy.  IMPRESSION: No acute or significant incidental extracardiac findings in the chest.   Electronically Signed By: Caprice Renshaw M.D. On: 08/30/2021 10:24  Narrative CLINICAL DATA:  Cardiovascular Disease Risk stratification  EXAM: Coronary Calcium Score  TECHNIQUE: A gated, non-contrast computed tomography scan of the heart was performed using 3mm slice thickness. Axial images were analyzed on a dedicated workstation. Calcium scoring of the coronary arteries was performed using the Agatston method.  FINDINGS: Coronary Calcium Score:  Left main: 83.8  Left anterior descending artery: 585  Left circumflex artery: 48.9  Right coronary artery: 149  Total: 867  Percentile: 99  Pericardium: Normal.  Ascending Aorta: Normal caliber.  Non-cardiac: See separate report from [facility].  IMPRESSION: Coronary calcium score of 867. This was 30 percentile for age-, race-, and sex-matched controls.  RECOMMENDATIONS: Coronary artery calcium (CAC) score is a strong predictor of incident coronary heart disease (CHD) and provides predictive information beyond traditional risk factors. CAC scoring is reasonable to use in the decision to withhold, postpone, or initiate statin therapy in intermediate-risk or selected borderline-risk asymptomatic adults (age 65-75 years and LDL-C >=70 to <190 mg/dL) who do not have diabetes or established atherosclerotic cardiovascular disease (ASCVD).* In intermediate-risk (10-year ASCVD risk >=7.5% to <20%) adults or selected borderline-risk (10-year ASCVD risk >=  5% to <7.5%) adults in whom a CAC score is measured for the purpose of making a treatment decision the following recommendations have been made:  If CAC=0, it is reasonable to withhold statin therapy and reassess in 5 to 10 years, as long as higher risk conditions are absent (diabetes mellitus, family history of premature CHD in first degree relatives (males <55  years; females <65 years), cigarette smoking, or LDL >=190 mg/dL).  If CAC is 1 to 99, it is reasonable to initiate statin therapy for patients >=74 years of age.  If CAC is >=100 or >=75th percentile, it is reasonable to initiate statin therapy at any age.  Cardiology referral should be considered for patients with CAC scores >=400 or >=75th percentile.  *2018 AHA/ACC/AACVPR/AAPA/ABC/ACPM/ADA/AGS/APhA/ASPC/NLA/PCNA Guideline on the Management of Blood Cholesterol: A Report of the American College of Cardiology/American Heart Association Task Force on Clinical Practice Guidelines. J Am Coll Cardiol. 2019;73(24):3168-3209.  Olga Millers, MD  Electronically Signed: By: Olga Millers M.D. On: 08/30/2021 10:09   CT SCANS  CT CARDIAC SCORING (SELF PAY ONLY) 08/08/2014  Addendum 08/08/2014  5:18 PM ADDENDUM REPORT: 08/08/2014 17:16  CLINICAL DATA:  Risk stratification  EXAM: Coronary Calcium Score  TECHNIQUE: The patient was scanned on a Siemens Sensation 16 slice scanner. Axial non-contrast 3mm slices were carried out through the heart. The data set was analyzed on a dedicated work station and scored using the Agatson method.  FINDINGS: Non-cardiac: No significant non cardiac findings on limited lung and soft tissue windows. See separate report from [facility].  Ascending Aorta:  3.2 cm  Pericardium: Some calcification of pericardium over the infundibulum of the RVOT  Coronary arteries: Multiple areas of calcification in the mid and distal LAD  IMPRESSION: Coronary calcium score of 31. This was 96th percentile for age and sex matched control.  Charlton Haws   Electronically Signed By: Charlton Haws M.D. On: 08/08/2014 17:16  Narrative EXAM: OVER-READ INTERPRETATION  CT CHEST  The following report is an over-read performed by radiologist Dr. Royal Piedra [facility] on 08/08/2014. This over-read does not include interpretation  of cardiac or coronary anatomy or pathology. The coronary calcium score interpretation by the cardiologist is attached.  COMPARISON:  No priors.  FINDINGS: Within the visualized portions of the thorax there are no suspicious appearing pulmonary nodules or masses, there is no acute consolidative airspace disease, no pleural effusion, no pneumothorax and no lymphadenopathy. Some pericardial calcification is identified overlying the infundibulum. Visualized portions of the upper abdomen are unremarkable. There are no aggressive appearing lytic or blastic lesions noted in the visualized portions of the skeleton. Diminutive median sternotomy wires are noted, presumably from remote child with median sternotomy.  IMPRESSION: 1. No significant incidental noncardiac findings noted. 2. 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  Electronically Signed: By: Trudie Reed M.D. On: 08/08/2014 16:45          EKG:        Recent Labs: 12/17/2022: ALT 45 12/26/2022: BUN 11; Creatinine, Ser 1.00; Hemoglobin 16.1; Platelets 239; Potassium 3.9; Sodium 136  Recent Lipid Panel    Component Value Date/Time   CHOL 125 06/12/2022 0845   TRIG 107 06/12/2022 0845   HDL 39 (L) 06/12/2022 0845   CHOLHDL 3.2 06/12/2022 0845   CHOLHDL 5 09/09/2014 0902   VLDL 23.6 09/09/2014 0902   LDLCALC 66 06/12/2022 0845   LDLDIRECT 155.7 09/26/2011 1031     Risk Assessment/Calculations:  Physical Exam:    VS:  BP 132/84   Pulse 83   Ht 5\' 11"  (1.803 m)   Wt 248 lb 9.6 oz (112.8 kg)   SpO2 98%   BMI 34.67 kg/m     Wt Readings from Last 3 Encounters:  01/03/23 248 lb 9.6 oz (112.8 kg)  12/26/22 240 lb 1.3 oz (108.9 kg)  11/15/22 240 lb (108.9 kg)     GEN:  Well nourished, well developed in no acute distress HEENT: Normal NECK: No JVD; No carotid bruits LYMPHATICS: No lymphadenopathy CARDIAC: RRR,  2/6 ejection murmur at the left upper sternal border RESPIRATORY:  Clear to auscultation without rales, wheezing or rhonchi  ABDOMEN: Soft, non-tender, non-distended MUSCULOSKELETAL:  No edema; No deformity  SKIN: Warm and dry NEUROLOGIC:  Alert and oriented x 3 PSYCHIATRIC:  Normal affect   Assessment & Plan Coronary artery disease involving native coronary artery of native heart with angina pectoris [facility]) The patient has had chest pain with atypical features.  I reviewed all the records from the emergency room which included a minimal high-sensitivity troponin elevation with a trend from 16 up to 19.  His EKG was nonischemic and unchanged from baseline.  The patient has been able to walk briskly for exercise without any exertional symptoms.  After shared decision-making conversation and in light of his objective data, we have elected to continue with medical therapy and observation.  I will see him back in 6 months.  He had a stress Myoview 3 months ago that showed no ischemia. Hyperlipidemia LDL goal <70 Treated with a combination of Zetia and rosuvastatin 40 mg.  LDL is 66. Essential hypertension Blood pressure controlled on current therapy.  Discussed lifestyle modification, exercise, and weight loss measures. Congenital pulmonary valve stenosis Stable based on most recent echo assessment.  There is mild pulmonic regurgitation and no pulmonary stenosis.            Medication Adjustments/Labs and Tests Ordered: Current medicines are reviewed at length with the patient today.  Concerns regarding medicines are outlined above.  No orders of the defined types were placed in this encounter.  No orders of the defined types were placed in this encounter.   Patient Instructions  Follow-Up: At [facility], you and your health needs are our priority.  As part of our continuing mission to provide you with exceptional heart care, we have created designated Provider Care Teams.   These Care Teams include your primary Cardiologist (physician) and Advanced Practice Providers (APPs -  Physician Assistants and Nurse Practitioners) who all work together to provide you with the care you need, when you need it.  Your next appointment:   6 month(s)  Provider:   [name], MD        Signed, [name], MD  01/07/2023 7:01 AM    [facility]

## 2023-01-03 NOTE — Patient Instructions (Signed)
Follow-Up: At [facility], you and your health needs are our priority.  As part of our continuing mission to provide you with exceptional heart care, we have created designated Provider Care Teams.  These Care Teams include your primary Cardiologist (physician) and Advanced Practice Providers (APPs -  Physician Assistants and Nurse Practitioners) who all work together to provide you with the care you need, when you need it.  Your next appointment:   6 month(s)  Provider:   [name], MD

## 2023-01-07 ENCOUNTER — Encounter: Payer: Self-pay | Admitting: Cardiovascular Disease

## 2023-01-07 NOTE — Assessment & Plan Note (Signed)
Stable based on most recent echo assessment.  There is mild pulmonic regurgitation and no pulmonary stenosis.

## 2023-02-03 ENCOUNTER — Encounter: Payer: Self-pay | Admitting: Cardiovascular Disease

## 2023-02-03 ENCOUNTER — Ambulatory Visit: Payer: 59 | Attending: Cardiovascular Disease | Admitting: Cardiovascular Disease

## 2023-02-03 VITALS — BP 140/100 | HR 86 | Ht 71.0 in | Wt 251.8 lb

## 2023-02-03 DIAGNOSIS — Z01810 Encounter for preprocedural cardiovascular examination: Secondary | ICD-10-CM

## 2023-02-03 DIAGNOSIS — I10 Essential (primary) hypertension: Secondary | ICD-10-CM | POA: Diagnosis not present

## 2023-02-03 DIAGNOSIS — I25119 Atherosclerotic heart disease of native coronary artery with unspecified angina pectoris: Secondary | ICD-10-CM

## 2023-02-03 DIAGNOSIS — E785 Hyperlipidemia, unspecified: Secondary | ICD-10-CM | POA: Diagnosis not present

## 2023-02-03 NOTE — Assessment & Plan Note (Signed)
The patient had a recent ER evaluation with minimal troponin elevation.  He has had recurrent chest discomfort with typical and atypical features.  After discussion of further noninvasive versus invasive diagnostic testing, we elected to proceed with cardiac catheterization through a shared decision-making process.  The patient understands to avoid strenuous physical activity before his heart catheterization which is scheduled later this week.  He will otherwise continue his current medical program which includes aspirin and a high intensity statin drug.

## 2023-02-03 NOTE — H&P (View-Only) (Signed)
 Cardiology Office Note:    Date:  02/03/2023   ID:  Brandon Kemp, DOB 1975/07/26, MRN [id number]  PCP:  Brandon Beams, MD   [facility] Providers Cardiologist:  [name], MD     Referring MD: Brandon Beams, MD   Chief Complaint  Patient presents with   Coronary Artery Disease   Chest Pain    History of Present Illness:    Brandon Kemp is a 47 y.o. male   Cardiovascular problems include hypertension, mixed hyperlipidemia, and coronary artery disease with total occlusion of the proximal LAD treated with drug-eluting stent implantation in July 2023. LVEF assessed by echo was 45 to 50% initially, improved to 50 to 55% on follow-up echo. A Myoview scan in July showed normal perfusion and normal LVEF 55 to 65%.   He is here alone today. He has had a few episodes of mild chest pressure that have occurred at rest and at times with emotional stress/anger. He has felt a pressure in the chest when this has occurred. Also reports feeling fatigued and having exertional dyspnea.   The patient was seen about 5 weeks ago for ER follow-up when he presented with chest pain and had minimal troponin elevation.  After further discussion, we elected to follow him medically.  However, he called back in after experiencing anginal symptoms and he was added onto my schedule today.  We had previously discussed consideration of relook cardiac catheterization because of concerns about his recurrent chest discomfort and history of atypical symptoms when he was first discovered to have a totally occluded proximal LAD.  Current Medications: Current Meds  Medication Sig   aspirin EC 81 MG tablet Take 81 mg by mouth daily. Swallow whole.   CALCIUM-VITAMIN D PO Take 1 tablet by mouth daily.   Coenzyme Q10 100 MG TABS Take 2 tablets (200 mg total) by mouth daily. (Patient taking differently: Take 100 mg by mouth daily.)   ezetimibe (ZETIA) 10 MG tablet Take 1 tablet (10 mg total) by mouth  daily.   L-Arginine 500 MG CAPS Take 500 mg by mouth daily.   Magnesium 500 MG TABS Take 500 mg by mouth daily.   methylphenidate (RITALIN LA) 20 MG 24 hr capsule Take 20 mg by mouth daily.   Multiple Vitamins-Minerals (MULTIVITAMIN WITH MINERALS) tablet Take 1 tablet by mouth daily.   nitroGLYCERIN (NITROSTAT) 0.4 MG SL tablet Place 1 tablet (0.4 mg total) under the tongue every 5 (five) minutes as needed.   rosuvastatin (CRESTOR) 40 MG tablet Take 1 tablet (40 mg total) by mouth daily.   sildenafil (REVATIO) 20 MG tablet Take 20 mg by mouth daily as needed (ED).   telmisartan (MICARDIS) 40 MG tablet TAKE 1 TABLET BY MOUTH EVERY DAY     Allergies:   Patient has no known allergies.   ROS:   Please see the history of present illness.    All other systems reviewed and are negative.  EKGs/Labs/Other Studies Reviewed:    The following studies were reviewed today: Cardiac Studies & Procedures   CARDIAC CATHETERIZATION  CARDIAC CATHETERIZATION 09/26/2021  Narrative 1.  Severe single-vessel coronary artery disease with subtotal stenosis of the proximal LAD, successfully treated with PCI using IVUS guidance (3.5 x 34 mm Onyx frontier DES) 2.  Patent left main, left circumflex, and dominant RCA with mild diffuse plaquing but no significant stenoses  Recommend: Same-day PCI discharge protocol if criteria met.  Dual antiplatelet therapy with aspirin and ticagrelor x12  months  Findings Coronary Findings Diagnostic  Dominance: Right  Left Main The left main is patent with no stenosis.  The vessel bifurcates into the LAD and left circumflex.  Left Anterior Descending Collaterals Dist LAD filled by collaterals from RPDA.  Prox LAD to Mid LAD lesion is 99% stenosed. The lesion is calcified.  First Septal Branch Collaterals 1st Sept filled by collaterals from RPDA.  Second Septal Branch Collaterals 2nd Sept filled by collaterals from RPDA.  Left Circumflex The circumflex is patent.   The vessel has mild diffuse plaquing without any high-grade stenosis.  The first OM branch is large in caliber and divides into twin vessels.  The AV circumflex is of medium caliber and supplies 2 small posterolateral branches.  First Obtuse Marginal Branch 1st Mrg lesion is 30% stenosed.  Right Coronary Artery Vessel is large. There is mild diffuse disease throughout the vessel. There is mild diffuse plaquing throughout the RCA.  The vessel is large, dominant, without significant stenosis.  The PDA and PLA branches are patent.  The LAD fills via right to left collaterals supplied through septal perforating branches  Intervention  Prox LAD to Mid LAD lesion Stent CATH VISTA GUIDE 6FR XBLAD3.5 guide catheter was inserted. Lesion crossed with guidewire using a WIRE COUGAR XT STRL 190CM. Pre-stent angioplasty was performed using a BALLN SAPPHIRE 2.5X15. Maximum pressure:  14 atm. A drug-eluting stent was successfully placed using a STENT ONYX FRONTIER 3.5X34. Post-stent angioplasty was performed using a BALL SAPPHIRE NC24 3.75X15. Maximum pressure:  18 atm. Post-Intervention Lesion Assessment The intervention was successful. Pre-interventional TIMI flow is 1. Post-intervention TIMI flow is 3. No complications occurred at this lesion. There is a 0% residual stenosis post intervention.   STRESS TESTS  MYOCARDIAL PERFUSION IMAGING 09/13/2022  Narrative   The study is normal. The study is low risk.   No ST deviation was noted.   Left ventricular function is normal. The left ventricular ejection fraction is normal (55-65%). End diastolic cavity size is normal.   Prior study not available for comparison.   ECHOCARDIOGRAM  ECHOCARDIOGRAM COMPLETE 02/20/2022  Narrative ECHOCARDIOGRAM REPORT    [name] Date of Exam: 02/20/2022 Medical Rec #:  [id number]         Height:       71.0 in Accession #:    [id number]        Weight:       240.2 lb Date of Birth:  [birth year]          BSA:          2.279 m Patient Age:    46 years          BP:           132/80 mmHg Patient Gender: M                 HR:           66 bpm. Exam Location:  [address]  Procedure: 2D Echo, Cardiac Doppler, Color Doppler and Strain Analysis  Indications:    I25.5 Ischemic cardiomyopathy  History:        Patient has prior history of Echocardiogram examinations, most recent 08/30/2021. CAD, Ischemic cardiomyopathy; Risk Factors:Hypertension, Dyslipidemia and Former Smoker.  Sonographer:    Samule Ohm RDCS Referring Phys: [phone] [place]idee Audi  IMPRESSIONS   1. Left ventricular ejection fraction, by estimation, is 50 to 55%. The left ventricle has low normal function. The left ventricle has  no regional wall motion abnormalities. Left ventricular diastolic parameters were normal. The average left ventricular global longitudinal strain is -12.9 %. The global longitudinal strain is abnormal. 2. Right ventricular systolic function is normal. The right ventricular size is normal. 3. The mitral valve is normal in structure. No evidence of mitral valve regurgitation. No evidence of mitral stenosis. 4. The aortic valve is tricuspid. There is mild thickening of the aortic valve. Aortic valve regurgitation is not visualized. Aortic valve sclerosis is present, with no evidence of aortic valve stenosis. 5. The inferior vena cava is normal in size with greater than 50% respiratory variability, suggesting right atrial pressure of 3 mmHg.  FINDINGS Left Ventricle: Left ventricular ejection fraction, by estimation, is 50 to 55%. The left ventricle has low normal function. The left ventricle has no regional wall motion abnormalities. The average left ventricular global longitudinal strain is -12.9 %. The global longitudinal strain is abnormal. The left ventricular internal cavity size was normal in size. There is no left ventricular hypertrophy. Left ventricular diastolic parameters were normal.  Normal left ventricular filling pressure.  Right Ventricle: The right ventricular size is normal. No increase in right ventricular wall thickness. Right ventricular systolic function is normal.  Left Atrium: Left atrial size was normal in size.  Right Atrium: Right atrial size was normal in size.  Pericardium: There is no evidence of pericardial effusion.  Mitral Valve: The mitral valve is normal in structure. No evidence of mitral valve regurgitation. No evidence of mitral valve stenosis.  Tricuspid Valve: The tricuspid valve is normal in structure. Tricuspid valve regurgitation is not demonstrated. No evidence of tricuspid stenosis.  Aortic Valve: The aortic valve is tricuspid. There is mild thickening of the aortic valve. Aortic valve regurgitation is not visualized. Aortic valve sclerosis is present, with no evidence of aortic valve stenosis.  Pulmonic Valve: The pulmonic valve was not well visualized. Pulmonic valve regurgitation is mild. No evidence of pulmonic stenosis.  Aorta: The aortic root is normal in size and structure.  Venous: The inferior vena cava is normal in size with greater than 50% respiratory variability, suggesting right atrial pressure of 3 mmHg.  IAS/Shunts: No atrial level shunt detected by color flow Doppler.   LEFT VENTRICLE PLAX 2D LVIDd:         4.60 cm   Diastology LVIDs:         3.45 cm   LV e' medial:    10.10 cm/s LV PW:         1.10 cm   LV E/e' medial:  7.4 LV IVS:        1.10 cm   LV e' lateral:   11.10 cm/s LVOT diam:     2.00 cm   LV E/e' lateral: 6.7 LV SV:         72 LV SV Index:   32        2D Longitudinal Strain LVOT Area:     3.14 cm  2D Strain GLS Avg:     -12.9 %   RIGHT VENTRICLE            IVC TAPSE (M-mode): 1.6 cm     IVC diam: 0.90 cm RVSP:           20.0 mmHg  LEFT ATRIUM             Index        RIGHT ATRIUM           Index LA diam:  3.30 cm 1.45 cm/m   RA Pressure: 3.00 mmHg LA Vol (A2C):   41.4 ml 18.16 ml/m   RA Area:     18.50 cm LA Vol (A4C):   51.4 ml 22.55 ml/m  RA Volume:   55.60 ml  24.39 ml/m LA Biplane Vol: 48.4 ml 21.24 ml/m AORTIC VALVE LVOT Vmax:   117.00 cm/s LVOT Vmean:  74.600 cm/s LVOT VTI:    0.230 m  AORTA Ao Root diam: 3.30 cm Ao Asc diam:  3.10 cm  MITRAL VALVE               TRICUSPID VALVE MV Area (PHT): 3.83 cm    TR Peak grad:   17.0 mmHg MV Decel Time: 198 msec    TR Vmax:        206.00 cm/s MV E velocity: 74.40 cm/s  Estimated RAP:  3.00 mmHg MV A velocity: 49.80 cm/s  RVSP:           20.0 mmHg MV E/A ratio:  1.49 SHUNTS Systemic VTI:  0.23 m Systemic Diam: 2.00 cm  Rachelle Hora Croitoru MD Electronically signed by Thurmon Fair MD Signature Date/Time: 02/20/2022/12:33:14 PM    Final     CT SCANS  CT CARDIAC SCORING (SELF PAY ONLY) 08/30/2021  Addendum 08/30/2021 11:56 AM ADDENDUM REPORT: 08/30/2021 10:24  EXAM: OVER-READ INTERPRETATION  CT CHEST  The following report is an over-read performed by radiologist Dr. Darlys Gales [facility] on 08/30/2021. This over-read does not include interpretation of cardiac or coronary anatomy or pathology. The coronary calcium score interpretation by the cardiologist is attached.  COMPARISON:  None.  FINDINGS: Vascular: No significant extracardiac vascular findings. Unchanged calcification of the pericardium overlying the right ventricular outflow tract.  Mediastinum/Nodes: No lymphadenopathy.  Lungs/Pleura: No focal airspace disease. No suspicious pulmonary nodules.  Upper Abdomen: No acute abnormality.  Musculoskeletal: No acute osseous abnormality. No suspicious osseous lesion. Prior median sternotomy.  IMPRESSION: No acute or significant incidental extracardiac findings in the chest.   Electronically Signed By: Caprice Renshaw M.D. On: 08/30/2021 10:24  Narrative CLINICAL DATA:  Cardiovascular Disease Risk stratification  EXAM: Coronary Calcium Score  TECHNIQUE: A gated,  non-contrast computed tomography scan of the heart was performed using 3mm slice thickness. Axial images were analyzed on a dedicated workstation. Calcium scoring of the coronary arteries was performed using the Agatston method.  FINDINGS: Coronary Calcium Score:  Left main: 83.8  Left anterior descending artery: 585  Left circumflex artery: 48.9  Right coronary artery: 149  Total: 867  Percentile: 99  Pericardium: Normal.  Ascending Aorta: Normal caliber.  Non-cardiac: See separate report from [facility].  IMPRESSION: Coronary calcium score of 867. This was 32 percentile for age-, race-, and sex-matched controls.  RECOMMENDATIONS: Coronary artery calcium (CAC) score is a strong predictor of incident coronary heart disease (CHD) and provides predictive information beyond traditional risk factors. CAC scoring is reasonable to use in the decision to withhold, postpone, or initiate statin therapy in intermediate-risk or selected borderline-risk asymptomatic adults (age 69-75 years and LDL-C >=70 to <190 mg/dL) who do not have diabetes or established atherosclerotic cardiovascular disease (ASCVD).* In intermediate-risk (10-year ASCVD risk >=7.5% to <20%) adults or selected borderline-risk (10-year ASCVD risk >=5% to <7.5%) adults in whom a CAC score is measured for the purpose of making a treatment decision the following recommendations have been made:  If CAC=0, it is reasonable to withhold statin therapy and reassess in 5 to 10 years, as long as higher  risk conditions are absent (diabetes mellitus, family history of premature CHD in first degree relatives (males <55 years; females <65 years), cigarette smoking, or LDL >=190 mg/dL).  If CAC is 1 to 99, it is reasonable to initiate statin therapy for patients >=2 years of age.  If CAC is >=100 or >=75th percentile, it is reasonable to initiate statin therapy at any age.  Cardiology referral should be  considered for patients with CAC scores >=400 or >=75th percentile.  *2018 AHA/ACC/AACVPR/AAPA/ABC/ACPM/ADA/AGS/APhA/ASPC/NLA/PCNA Guideline on the Management of Blood Cholesterol: A Report of the American College of Cardiology/American Heart Association Task Force on Clinical Practice Guidelines. J Am Coll Cardiol. 2019;73(24):3168-3209.  Olga Millers, MD  Electronically Signed: By: Olga Millers M.D. On: 08/30/2021 10:09   CT SCANS  CT CARDIAC SCORING (SELF PAY ONLY) 08/08/2014  Addendum 08/08/2014  5:18 PM ADDENDUM REPORT: 08/08/2014 17:16  CLINICAL DATA:  Risk stratification  EXAM: Coronary Calcium Score  TECHNIQUE: The patient was scanned on a Siemens Sensation 16 slice scanner. Axial non-contrast 3mm slices were carried out through the heart. The data set was analyzed on a dedicated work station and scored using the Agatson method.  FINDINGS: Non-cardiac: No significant non cardiac findings on limited lung and soft tissue windows. See separate report from [facility].  Ascending Aorta:  3.2 cm  Pericardium: Some calcification of pericardium over the infundibulum of the RVOT  Coronary arteries: Multiple areas of calcification in the mid and distal LAD  IMPRESSION: Coronary calcium score of 31. This was 96th percentile for age and sex matched control.  Charlton Haws   Electronically Signed By: Charlton Haws M.D. On: 08/08/2014 17:16  Narrative EXAM: OVER-READ INTERPRETATION  CT CHEST  The following report is an over-read performed by radiologist Dr. Royal Piedra [facility] on 08/08/2014. This over-read does not include interpretation of cardiac or coronary anatomy or pathology. The coronary calcium score interpretation by the cardiologist is attached.  COMPARISON:  No priors.  FINDINGS: Within the visualized portions of the thorax there are no suspicious appearing pulmonary nodules or masses, there is no  acute consolidative airspace disease, no pleural effusion, no pneumothorax and no lymphadenopathy. Some pericardial calcification is identified overlying the infundibulum. Visualized portions of the upper abdomen are unremarkable. There are no aggressive appearing lytic or blastic lesions noted in the visualized portions of the skeleton. Diminutive median sternotomy wires are noted, presumably from remote child with median sternotomy.  IMPRESSION: 1. No significant incidental noncardiac findings noted. 2. 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  Electronically Signed: By: Trudie Reed M.D. On: 08/08/2014 16:45          EKG:   EKG Interpretation Date/Time:  Monday February 03 2023 13:38:12 EST Ventricular Rate:  86 PR Interval:  160 QRS Duration:  94 QT Interval:  352 QTC Calculation: 421 R Axis:   80  Text Interpretation: Normal sinus rhythm Nonspecific T wave abnormality When compared with ECG of 25-Dec-2022 23:50, No significant change was found Confirmed by [name] [phone]) on 02/03/2023 1:40:10 PM    Recent Labs: 12/17/2022: ALT 45 12/26/2022: BUN 11; Creatinine, Ser 1.00; Hemoglobin 16.1; Platelets 239; Potassium 3.9; Sodium 136  Recent Lipid Panel    Component Value Date/Time   CHOL 125 06/12/2022 0845   TRIG 107 06/12/2022 0845   HDL 39 (L) 06/12/2022 0845   CHOLHDL 3.2 06/12/2022 0845   CHOLHDL 5 09/09/2014 0902   VLDL 23.6 09/09/2014 0902  LDLCALC 66 06/12/2022 0845   LDLDIRECT 155.7 09/26/2011 1031         Physical Exam:    VS:  BP (!) 140/100   Pulse 86   Ht 5\' 11"  (1.803 m)   Wt 251 lb 12.8 oz (114.2 kg)   SpO2 97%   BMI 35.12 kg/m     Wt Readings from Last 3 Encounters:  02/03/23 251 lb 12.8 oz (114.2 kg)  01/03/23 248 lb 9.6 oz (112.8 kg)  12/26/22 240 lb 1.3 oz (108.9 kg)     GEN:  Well nourished, well developed in no acute distress HEENT:  Normal NECK: No JVD; No carotid bruits LYMPHATICS: No lymphadenopathy CARDIAC: RRR, 2/6 systolic ejection murmur at the left upper sternal border RESPIRATORY:  Clear to auscultation without rales, wheezing or rhonchi  ABDOMEN: Soft, non-tender, non-distended MUSCULOSKELETAL:  No edema; No deformity  SKIN: Warm and dry NEUROLOGIC:  Alert and oriented x 3 PSYCHIATRIC:  Normal affect   Assessment & Plan Coronary artery disease involving native coronary artery of native heart with angina pectoris [facility]) The patient had a recent ER evaluation with minimal troponin elevation.  He has had recurrent chest discomfort with typical and atypical features.  After discussion of further noninvasive versus invasive diagnostic testing, we elected to proceed with cardiac catheterization through a shared decision-making process.  The patient understands to avoid strenuous physical activity before his heart catheterization which is scheduled later this week.  He will otherwise continue his current medical program which includes aspirin and a high intensity statin drug. Pre-procedural cardiovascular examination We will check preprocedure labs today.  He will take all of his medications the morning of the procedure. Essential hypertension The patient did not take his medications this morning.  He will take them as soon as he gets home.  His blood pressure has previously been controlled on telmisartan 40 mg daily. Hyperlipidemia LDL goal <70 Treated with Zetia and rosuvastatin 40 mg daily.  Goal LDL cholesterol less than 55.  Last lipids with an LDL of 66.       Informed Consent   Shared Decision Making/Informed Consent The risks [stroke (1 in 1000), death (1 in 1000), kidney failure [usually temporary] (1 in 500), bleeding (1 in 200), allergic reaction [possibly serious] (1 in 200)], benefits (diagnostic support and management of coronary artery disease) and alternatives of a cardiac catheterization were  discussed in detail with Brandon Kemp and he is willing to proceed.       Medication Adjustments/Labs and Tests Ordered: Current medicines are reviewed at length with the patient today.  Concerns regarding medicines are outlined above.  Orders Placed This Encounter  Procedures   CBC   Basic metabolic panel   EKG 12-Lead   No orders of the defined types were placed in this encounter.   Patient Instructions  Lab Work: CBC, BMET today If you have labs (blood work) drawn today and your tests are completely normal, you will receive your results only by: MyChart Message (if you have MyChart) OR A paper copy in the mail If you have any lab test that is abnormal or we need to change your treatment, we will call you to review the results.  Testing/Procedures: L Heart Catheterization Your physician has requested that you have a cardiac catheterization. Cardiac catheterization is used to diagnose and/or treat various heart conditions. Doctors may recommend this procedure for a number of different reasons. The most common reason is to evaluate chest pain. Chest pain can be  a symptom of coronary artery disease (CAD), and cardiac catheterization can show whether plaque is narrowing or blocking your heart's arteries. This procedure is also used to evaluate the valves, as well as measure the blood flow and oxygen levels in different parts of your heart. For further information please visit [email or web address] Please follow instruction sheet, as given.  Follow-Up: At [facility], you and your health needs are our priority.  As part of our continuing mission to provide you with exceptional heart care, we have created designated Provider Care Teams.  These Care Teams include your primary Cardiologist (physician) and Advanced Practice Providers (APPs -  Physician Assistants and Nurse Practitioners) who all work together to provide you with the care you need, when you need it.  Your next  appointment:   As scheduled  Provider:   [name], MD      Other Instructions       Cardiac/Peripheral Catheterization   You are scheduled for a Cardiac Catheterization on Friday, December 6 with Dr. [name].  1. Please arrive at the [facility] (Main Entrance A) at [facility]: [address] [place], [place] [address] at 10:00 AM (This time is two hour(s) before your procedure to ensure your preparation).   Free valet parking service is available. You will check in at ADMITTING. The support person will be asked to wait in the waiting room.  It is OK to have someone drop you off and come back when you are ready to be discharged.        Special note: Every effort is made to have your procedure done on time. Please understand that emergencies sometimes delay scheduled procedures.  2. Diet: Do not eat solid foods after midnight.  You may have clear liquids until 5 AM the day of the procedure.  3. Labs: TODAY  4. Medication instructions in preparation for your procedure:   Contrast Allergy: No  DO NOT TAKE Sildenafil within 3 days of procedure  On the morning of your procedure, take Aspirin 81 mg and any morning medicines NOT listed above.  You may use sips of water.  5. Plan to go home the same day, you will only stay overnight if medically necessary. 6. You MUST have a responsible adult to drive you home. 7. An adult MUST be with you the first 24 hours after you arrive home. 8. Bring a current list of your medications, and the last time and date medication taken. 9. Bring ID and current insurance cards. 10.Please wear clothes that are easy to get on and off and wear slip-on shoes.  Thank you for allowing [place] to care for you!   -- [facility] Invasive Cardiovascular services    Signed, [name], MD  02/03/2023 5:51 PM    [facility]

## 2023-02-03 NOTE — Patient Instructions (Signed)
Lab Work: CBC, BMET today If you have labs (blood work) drawn today and your tests are completely normal, you will receive your results only by: MyChart Message (if you have MyChart) OR A paper copy in the mail If you have any lab test that is abnormal or we need to change your treatment, we will call you to review the results.  Testing/Procedures: L Heart Catheterization Your physician has requested that you have a cardiac catheterization. Cardiac catheterization is used to diagnose and/or treat various heart conditions. Doctors may recommend this procedure for a number of different reasons. The most common reason is to evaluate chest pain. Chest pain can be a symptom of coronary artery disease (CAD), and cardiac catheterization can show whether plaque is narrowing or blocking your heart's arteries. This procedure is also used to evaluate the valves, as well as measure the blood flow and oxygen levels in different parts of your heart. For further information please visit [email or web address] Please follow instruction sheet, as given.  Follow-Up: At [facility], you and your health needs are our priority.  As part of our continuing mission to provide you with exceptional heart care, we have created designated Provider Care Teams.  These Care Teams include your primary Cardiologist (physician) and Advanced Practice Providers (APPs -  Physician Assistants and Nurse Practitioners) who all work together to provide you with the care you need, when you need it.  Your next appointment:   As scheduled  Provider:   [name], MD      Other Instructions       Cardiac/Peripheral Catheterization   You are scheduled for a Cardiac Catheterization on Friday, December 6 with Dr. [name].  1. Please arrive at the [facility] (Main Entrance A) at [facility]: [address] [place], [place] [address] at 10:00 AM (This time is two hour(s) before your procedure to ensure your  preparation).   Free valet parking service is available. You will check in at ADMITTING. The support person will be asked to wait in the waiting room.  It is OK to have someone drop you off and come back when you are ready to be discharged.        Special note: Every effort is made to have your procedure done on time. Please understand that emergencies sometimes delay scheduled procedures.  2. Diet: Do not eat solid foods after midnight.  You may have clear liquids until 5 AM the day of the procedure.  3. Labs: TODAY  4. Medication instructions in preparation for your procedure:   Contrast Allergy: No  DO NOT TAKE Sildenafil within 3 days of procedure  On the morning of your procedure, take Aspirin 81 mg and any morning medicines NOT listed above.  You may use sips of water.  5. Plan to go home the same day, you will only stay overnight if medically necessary. 6. You MUST have a responsible adult to drive you home. 7. An adult MUST be with you the first 24 hours after you arrive home. 8. Bring a current list of your medications, and the last time and date medication taken. 9. Bring ID and current insurance cards. 10.Please wear clothes that are easy to get on and off and wear slip-on shoes.  Thank you for allowing [place] to care for you!   -- [facility]

## 2023-02-03 NOTE — Progress Notes (Signed)
Cardiology Office Note:    Date:  02/03/2023   ID:  Brandon Kemp, DOB 1975/07/26, MRN [id number]  PCP:  Aliene Beams, MD   Brandon Kemp Cardiologist:  [name], MD     Referring MD: Aliene Beams, MD   Chief Complaint  Patient presents with   Coronary Artery Disease   Chest Pain    History of Present Illness:    Brandon Kemp is a 47 y.o. male   Cardiovascular problems include hypertension, mixed hyperlipidemia, and coronary artery disease with total occlusion of the proximal LAD treated with drug-eluting stent implantation in July 2023. LVEF assessed by echo was 45 to 50% initially, improved to 50 to 55% on follow-up echo. A Myoview scan in July showed normal perfusion and normal LVEF 55 to 65%.   He is here alone today. He has had a few episodes of mild chest pressure that have occurred at rest and at times with emotional stress/anger. He has felt a pressure in the chest when this has occurred. Also reports feeling fatigued and having exertional dyspnea.   The patient was seen about 5 weeks ago for ER follow-up when he presented with chest pain and had minimal troponin elevation.  After further discussion, we elected to follow him medically.  However, he called back in after experiencing anginal symptoms and he was added onto my schedule today.  We had previously discussed consideration of relook cardiac catheterization because of concerns about his recurrent chest discomfort and history of atypical symptoms when he was first discovered to have a totally occluded proximal LAD.  Current Medications: Current Meds  Medication Sig   aspirin EC 81 MG tablet Take 81 mg by mouth daily. Swallow whole.   CALCIUM-VITAMIN D PO Take 1 tablet by mouth daily.   Coenzyme Q10 100 MG TABS Take 2 tablets (200 mg total) by mouth daily. (Patient taking differently: Take 100 mg by mouth daily.)   ezetimibe (ZETIA) 10 MG tablet Take 1 tablet (10 mg total) by mouth  daily.   L-Arginine 500 MG CAPS Take 500 mg by mouth daily.   Magnesium 500 MG TABS Take 500 mg by mouth daily.   methylphenidate (RITALIN LA) 20 MG 24 hr capsule Take 20 mg by mouth daily.   Multiple Vitamins-Minerals (MULTIVITAMIN WITH MINERALS) tablet Take 1 tablet by mouth daily.   nitroGLYCERIN (NITROSTAT) 0.4 MG SL tablet Place 1 tablet (0.4 mg total) under the tongue every 5 (five) minutes as needed.   rosuvastatin (CRESTOR) 40 MG tablet Take 1 tablet (40 mg total) by mouth daily.   sildenafil (REVATIO) 20 MG tablet Take 20 mg by mouth daily as needed (ED).   telmisartan (MICARDIS) 40 MG tablet TAKE 1 TABLET BY MOUTH EVERY DAY     Allergies:   Patient has no known allergies.   ROS:   Please see the history of present illness.    All other systems reviewed and are negative.  EKGs/Labs/Other Studies Reviewed:    The following studies were reviewed today: Cardiac Studies & Procedures   CARDIAC CATHETERIZATION  CARDIAC CATHETERIZATION 09/26/2021  Narrative 1.  Severe single-vessel coronary artery disease with subtotal stenosis of the proximal LAD, successfully treated with PCI using IVUS guidance (3.5 x 34 mm Onyx frontier DES) 2.  Patent left main, left circumflex, and dominant RCA with mild diffuse plaquing but no significant stenoses  Recommend: Same-day PCI discharge protocol if criteria met.  Dual antiplatelet therapy with aspirin and ticagrelor x12  months  Findings Coronary Findings Diagnostic  Dominance: Right  Left Main The left main is patent with no stenosis.  The vessel bifurcates into the LAD and left circumflex.  Left Anterior Descending Collaterals Dist LAD filled by collaterals from RPDA.  Prox LAD to Mid LAD lesion is 99% stenosed. The lesion is calcified.  First Septal Branch Collaterals 1st Sept filled by collaterals from RPDA.  Second Septal Branch Collaterals 2nd Sept filled by collaterals from RPDA.  Left Circumflex The circumflex is patent.   The vessel has mild diffuse plaquing without any high-grade stenosis.  The first OM branch is large in caliber and divides into twin vessels.  The AV circumflex is of medium caliber and supplies 2 small posterolateral branches.  First Obtuse Marginal Branch 1st Mrg lesion is 30% stenosed.  Right Coronary Artery Vessel is large. There is mild diffuse disease throughout the vessel. There is mild diffuse plaquing throughout the RCA.  The vessel is large, dominant, without significant stenosis.  The PDA and PLA branches are patent.  The LAD fills via right to left collaterals supplied through septal perforating branches  Intervention  Prox LAD to Mid LAD lesion Stent CATH VISTA GUIDE 6FR XBLAD3.5 guide catheter was inserted. Lesion crossed with guidewire using a WIRE COUGAR XT STRL 190CM. Pre-stent angioplasty was performed using a BALLN SAPPHIRE 2.5X15. Maximum pressure:  14 atm. A drug-eluting stent was successfully placed using a STENT ONYX FRONTIER 3.5X34. Post-stent angioplasty was performed using a BALL SAPPHIRE NC24 3.75X15. Maximum pressure:  18 atm. Post-Intervention Lesion Assessment The intervention was successful. Pre-interventional TIMI flow is 1. Post-intervention TIMI flow is 3. No complications occurred at this lesion. There is a 0% residual stenosis post intervention.   STRESS TESTS  MYOCARDIAL PERFUSION IMAGING 09/13/2022  Narrative   The study is normal. The study is low risk.   No ST deviation was noted.   Left ventricular function is normal. The left ventricular ejection fraction is normal (55-65%). End diastolic cavity size is normal.   Prior study not available for comparison.   ECHOCARDIOGRAM  ECHOCARDIOGRAM COMPLETE 02/20/2022  Narrative ECHOCARDIOGRAM REPORT    [name] Date of Exam: 02/20/2022 Medical Rec #:  [id number]         Height:       71.0 in Accession #:    [id number]        Weight:       240.2 lb Date of Birth:  [birth year]          BSA:          2.279 m Patient Age:    46 years          BP:           132/80 mmHg Patient Gender: M                 HR:           66 bpm. Exam Location:  [address]  Procedure: 2D Echo, Cardiac Doppler, Color Doppler and Strain Analysis  Indications:    I25.5 Ischemic cardiomyopathy  History:        Patient has prior history of Echocardiogram examinations, most recent 08/30/2021. CAD, Ischemic cardiomyopathy; Risk Factors:Hypertension, Dyslipidemia and Former Smoker.  Sonographer:    Samule Ohm RDCS Referring Phys: [phone] Heidee Audi  IMPRESSIONS   1. Left ventricular ejection fraction, by estimation, is 50 to 55%. The left ventricle has low normal function. The left ventricle has  no regional wall motion abnormalities. Left ventricular diastolic parameters were normal. The average left ventricular global longitudinal strain is -12.9 %. The global longitudinal strain is abnormal. 2. Right ventricular systolic function is normal. The right ventricular size is normal. 3. The mitral valve is normal in structure. No evidence of mitral valve regurgitation. No evidence of mitral stenosis. 4. The aortic valve is tricuspid. There is mild thickening of the aortic valve. Aortic valve regurgitation is not visualized. Aortic valve sclerosis is present, with no evidence of aortic valve stenosis. 5. The inferior vena cava is normal in size with greater than 50% respiratory variability, suggesting right atrial pressure of 3 mmHg.  FINDINGS Left Ventricle: Left ventricular ejection fraction, by estimation, is 50 to 55%. The left ventricle has low normal function. The left ventricle has no regional wall motion abnormalities. The average left ventricular global longitudinal strain is -12.9 %. The global longitudinal strain is abnormal. The left ventricular internal cavity size was normal in size. There is no left ventricular hypertrophy. Left ventricular diastolic parameters were normal.  Normal left ventricular filling pressure.  Right Ventricle: The right ventricular size is normal. No increase in right ventricular wall thickness. Right ventricular systolic function is normal.  Left Atrium: Left atrial size was normal in size.  Right Atrium: Right atrial size was normal in size.  Pericardium: There is no evidence of pericardial effusion.  Mitral Valve: The mitral valve is normal in structure. No evidence of mitral valve regurgitation. No evidence of mitral valve stenosis.  Tricuspid Valve: The tricuspid valve is normal in structure. Tricuspid valve regurgitation is not demonstrated. No evidence of tricuspid stenosis.  Aortic Valve: The aortic valve is tricuspid. There is mild thickening of the aortic valve. Aortic valve regurgitation is not visualized. Aortic valve sclerosis is present, with no evidence of aortic valve stenosis.  Pulmonic Valve: The pulmonic valve was not well visualized. Pulmonic valve regurgitation is mild. No evidence of pulmonic stenosis.  Aorta: The aortic root is normal in size and structure.  Venous: The inferior vena cava is normal in size with greater than 50% respiratory variability, suggesting right atrial pressure of 3 mmHg.  IAS/Shunts: No atrial level shunt detected by color flow Doppler.   LEFT VENTRICLE PLAX 2D LVIDd:         4.60 cm   Diastology LVIDs:         3.45 cm   LV e' medial:    10.10 cm/s LV PW:         1.10 cm   LV E/e' medial:  7.4 LV IVS:        1.10 cm   LV e' lateral:   11.10 cm/s LVOT diam:     2.00 cm   LV E/e' lateral: 6.7 LV SV:         72 LV SV Index:   32        2D Longitudinal Strain LVOT Area:     3.14 cm  2D Strain GLS Avg:     -12.9 %   RIGHT VENTRICLE            IVC TAPSE (M-mode): 1.6 cm     IVC diam: 0.90 cm RVSP:           20.0 mmHg  LEFT ATRIUM             Index        RIGHT ATRIUM           Index LA diam:  3.30 cm 1.45 cm/m   RA Pressure: 3.00 mmHg LA Vol (A2C):   41.4 ml 18.16 ml/m   RA Area:     18.50 cm LA Vol (A4C):   51.4 ml 22.55 ml/m  RA Volume:   55.60 ml  24.39 ml/m LA Biplane Vol: 48.4 ml 21.24 ml/m AORTIC VALVE LVOT Vmax:   117.00 cm/s LVOT Vmean:  74.600 cm/s LVOT VTI:    0.230 m  AORTA Ao Root diam: 3.30 cm Ao Asc diam:  3.10 cm  MITRAL VALVE               TRICUSPID VALVE MV Area (PHT): 3.83 cm    TR Peak grad:   17.0 mmHg MV Decel Time: 198 msec    TR Vmax:        206.00 cm/s MV E velocity: 74.40 cm/s  Estimated RAP:  3.00 mmHg MV A velocity: 49.80 cm/s  RVSP:           20.0 mmHg MV E/A ratio:  1.49 SHUNTS Systemic VTI:  0.23 m Systemic Diam: 2.00 cm  Rachelle Hora Croitoru MD Electronically signed by Thurmon Fair MD Signature Date/Time: 02/20/2022/12:33:14 PM    Final     CT SCANS  CT CARDIAC SCORING (SELF PAY ONLY) 08/30/2021  Addendum 08/30/2021 11:56 AM ADDENDUM REPORT: 08/30/2021 10:24  EXAM: OVER-READ INTERPRETATION  CT CHEST  The following report is an over-read performed by radiologist Dr. Darlys Gales [facility] on 08/30/2021. This over-read does not include interpretation of cardiac or coronary anatomy or pathology. The coronary calcium score interpretation by the cardiologist is attached.  COMPARISON:  None.  FINDINGS: Vascular: No significant extracardiac vascular findings. Unchanged calcification of the pericardium overlying the right ventricular outflow tract.  Mediastinum/Nodes: No lymphadenopathy.  Lungs/Pleura: No focal airspace disease. No suspicious pulmonary nodules.  Upper Abdomen: No acute abnormality.  Musculoskeletal: No acute osseous abnormality. No suspicious osseous lesion. Prior median sternotomy.  IMPRESSION: No acute or significant incidental extracardiac findings in the chest.   Electronically Signed By: Caprice Renshaw M.D. On: 08/30/2021 10:24  Narrative CLINICAL DATA:  Cardiovascular Disease Risk stratification  EXAM: Coronary Calcium Score  TECHNIQUE: A gated,  non-contrast computed tomography scan of the heart was performed using 3mm slice thickness. Axial images were analyzed on a dedicated workstation. Calcium scoring of the coronary arteries was performed using the Agatston method.  FINDINGS: Coronary Calcium Score:  Left main: 83.8  Left anterior descending artery: 585  Left circumflex artery: 48.9  Right coronary artery: 149  Total: 867  Percentile: 99  Pericardium: Normal.  Ascending Aorta: Normal caliber.  Non-cardiac: See separate report from [facility].  IMPRESSION: Coronary calcium score of 867. This was 32 percentile for age-, race-, and sex-matched controls.  RECOMMENDATIONS: Coronary artery calcium (CAC) score is a strong predictor of incident coronary heart disease (CHD) and provides predictive information beyond traditional risk factors. CAC scoring is reasonable to use in the decision to withhold, postpone, or initiate statin therapy in intermediate-risk or selected borderline-risk asymptomatic adults (age 69-75 years and LDL-C >=70 to <190 mg/dL) who do not have diabetes or established atherosclerotic cardiovascular disease (ASCVD).* In intermediate-risk (10-year ASCVD risk >=7.5% to <20%) adults or selected borderline-risk (10-year ASCVD risk >=5% to <7.5%) adults in whom a CAC score is measured for the purpose of making a treatment decision the following recommendations have been made:  If CAC=0, it is reasonable to withhold statin therapy and reassess in 5 to 10 years, as long as higher  risk conditions are absent (diabetes mellitus, family history of premature CHD in first degree relatives (males <55 years; females <65 years), cigarette smoking, or LDL >=190 mg/dL).  If CAC is 1 to 99, it is reasonable to initiate statin therapy for patients >=2 years of age.  If CAC is >=100 or >=75th percentile, it is reasonable to initiate statin therapy at any age.  Cardiology referral should be  considered for patients with CAC scores >=400 or >=75th percentile.  *2018 AHA/ACC/AACVPR/AAPA/ABC/ACPM/ADA/AGS/APhA/ASPC/NLA/PCNA Guideline on the Management of Blood Cholesterol: A Report of the American College of Cardiology/American Heart Association Task Force on Clinical Practice Guidelines. J Am Coll Cardiol. 2019;73(24):3168-3209.  Olga Millers, MD  Electronically Signed: By: Olga Millers M.D. On: 08/30/2021 10:09   CT SCANS  CT CARDIAC SCORING (SELF PAY ONLY) 08/08/2014  Addendum 08/08/2014  5:18 PM ADDENDUM REPORT: 08/08/2014 17:16  CLINICAL DATA:  Risk stratification  EXAM: Coronary Calcium Score  TECHNIQUE: The patient was scanned on a Siemens Sensation 16 slice scanner. Axial non-contrast 3mm slices were carried out through the heart. The data set was analyzed on a dedicated work station and scored using the Agatson method.  FINDINGS: Non-cardiac: No significant non cardiac findings on limited lung and soft tissue windows. See separate report from [facility].  Ascending Aorta:  3.2 cm  Pericardium: Some calcification of pericardium over the infundibulum of the RVOT  Coronary arteries: Multiple areas of calcification in the mid and distal LAD  IMPRESSION: Coronary calcium score of 31. This was 96th percentile for age and sex matched control.  Charlton Haws   Electronically Signed By: Charlton Haws M.D. On: 08/08/2014 17:16  Narrative EXAM: OVER-READ INTERPRETATION  CT CHEST  The following report is an over-read performed by radiologist Dr. Royal Piedra [facility] on 08/08/2014. This over-read does not include interpretation of cardiac or coronary anatomy or pathology. The coronary calcium score interpretation by the cardiologist is attached.  COMPARISON:  No priors.  FINDINGS: Within the visualized portions of the thorax there are no suspicious appearing pulmonary nodules or masses, there is no  acute consolidative airspace disease, no pleural effusion, no pneumothorax and no lymphadenopathy. Some pericardial calcification is identified overlying the infundibulum. Visualized portions of the upper abdomen are unremarkable. There are no aggressive appearing lytic or blastic lesions noted in the visualized portions of the skeleton. Diminutive median sternotomy wires are noted, presumably from remote child with median sternotomy.  IMPRESSION: 1. No significant incidental noncardiac findings noted. 2. 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  Electronically Signed: By: Trudie Reed M.D. On: 08/08/2014 16:45          EKG:   EKG Interpretation Date/Time:  Monday February 03 2023 13:38:12 EST Ventricular Rate:  86 PR Interval:  160 QRS Duration:  94 QT Interval:  352 QTC Calculation: 421 R Axis:   80  Text Interpretation: Normal sinus rhythm Nonspecific T wave abnormality When compared with ECG of 25-Dec-2022 23:50, No significant change was found Confirmed by [name] [phone]) on 02/03/2023 1:40:10 PM    Recent Labs: 12/17/2022: ALT 45 12/26/2022: BUN 11; Creatinine, Ser 1.00; Hemoglobin 16.1; Platelets 239; Potassium 3.9; Sodium 136  Recent Lipid Panel    Component Value Date/Time   CHOL 125 06/12/2022 0845   TRIG 107 06/12/2022 0845   HDL 39 (L) 06/12/2022 0845   CHOLHDL 3.2 06/12/2022 0845   CHOLHDL 5 09/09/2014 0902   VLDL 23.6 09/09/2014 0902  LDLCALC 66 06/12/2022 0845   LDLDIRECT 155.7 09/26/2011 1031         Physical Exam:    VS:  BP (!) 140/100   Pulse 86   Ht 5\' 11"  (1.803 m)   Wt 251 lb 12.8 oz (114.2 kg)   SpO2 97%   BMI 35.12 kg/m     Wt Readings from Last 3 Encounters:  02/03/23 251 lb 12.8 oz (114.2 kg)  01/03/23 248 lb 9.6 oz (112.8 kg)  12/26/22 240 lb 1.3 oz (108.9 kg)     GEN:  Well nourished, well developed in no acute distress HEENT:  Normal NECK: No JVD; No carotid bruits LYMPHATICS: No lymphadenopathy CARDIAC: RRR, 2/6 systolic ejection murmur at the left upper sternal border RESPIRATORY:  Clear to auscultation without rales, wheezing or rhonchi  ABDOMEN: Soft, non-tender, non-distended MUSCULOSKELETAL:  No edema; No deformity  SKIN: Warm and dry NEUROLOGIC:  Alert and oriented x 3 PSYCHIATRIC:  Normal affect   Assessment & Plan Coronary artery disease involving native coronary artery of native heart with angina pectoris [facility]) The patient had a recent ER evaluation with minimal troponin elevation.  He has had recurrent chest discomfort with typical and atypical features.  After discussion of further noninvasive versus invasive diagnostic testing, we elected to proceed with cardiac catheterization through a shared decision-making process.  The patient understands to avoid strenuous physical activity before his heart catheterization which is scheduled later this week.  He will otherwise continue his current medical program which includes aspirin and a high intensity statin drug. Pre-procedural cardiovascular examination We will check preprocedure labs today.  He will take all of his medications the morning of the procedure. Essential hypertension The patient did not take his medications this morning.  He will take them as soon as he gets home.  His blood pressure has previously been controlled on telmisartan 40 mg daily. Hyperlipidemia LDL goal <70 Treated with Zetia and rosuvastatin 40 mg daily.  Goal LDL cholesterol less than 55.  Last lipids with an LDL of 66.       Informed Consent   Shared Decision Making/Informed Consent The risks [stroke (1 in 1000), death (1 in 1000), kidney failure [usually temporary] (1 in 500), bleeding (1 in 200), allergic reaction [possibly serious] (1 in 200)], benefits (diagnostic support and management of coronary artery disease) and alternatives of a cardiac catheterization were  discussed in detail with Mr. Casteen and he is willing to proceed.       Medication Adjustments/Labs and Tests Ordered: Current medicines are reviewed at length with the patient today.  Concerns regarding medicines are outlined above.  Orders Placed This Encounter  Procedures   CBC   Basic metabolic panel   EKG 12-Lead   No orders of the defined types were placed in this encounter.   Patient Instructions  Lab Work: CBC, BMET today If you have labs (blood work) drawn today and your tests are completely normal, you will receive your results only by: MyChart Message (if you have MyChart) OR A paper copy in the mail If you have any lab test that is abnormal or we need to change your treatment, we will call you to review the results.  Testing/Procedures: L Heart Catheterization Your physician has requested that you have a cardiac catheterization. Cardiac catheterization is used to diagnose and/or treat various heart conditions. Doctors may recommend this procedure for a number of different reasons. The most common reason is to evaluate chest pain. Chest pain can be  a symptom of coronary artery disease (CAD), and cardiac catheterization can show whether plaque is narrowing or blocking your heart's arteries. This procedure is also used to evaluate the valves, as well as measure the blood flow and oxygen levels in different parts of your heart. For further information please visit [email or web address] Please follow instruction sheet, as given.  Follow-Up: At [facility], you and your health needs are our priority.  As part of our continuing mission to provide you with exceptional heart care, we have created designated Provider Care Teams.  These Care Teams include your primary Cardiologist (physician) and [facility] (APPs -  Physician Assistants and Nurse Practitioners) who all work together to provide you with the care you need, when you need it.  Your next  appointment:   As scheduled  Provider:   [name], MD      Other Instructions       Cardiac/Peripheral Catheterization   You are scheduled for a Cardiac Catheterization on Friday, December 6 with Dr. [name].  1. Please arrive at the [facility] (Main Entrance A) at [facility]: [address] [place], [place] [address] at 10:00 AM (This time is two hour(s) before your procedure to ensure your preparation).   Free valet parking service is available. You will check in at ADMITTING. The support person will be asked to wait in the waiting room.  It is OK to have someone drop you off and come back when you are ready to be discharged.        Special note: Every effort is made to have your procedure done on time. Please understand that emergencies sometimes delay scheduled procedures.  2. Diet: Do not eat solid foods after midnight.  You may have clear liquids until 5 AM the day of the procedure.  3. Labs: TODAY  4. Medication instructions in preparation for your procedure:   Contrast Allergy: No  DO NOT TAKE Sildenafil within 3 days of procedure  On the morning of your procedure, take Aspirin 81 mg and any morning medicines NOT listed above.  You may use sips of water.  5. Plan to go home the same day, you will only stay overnight if medically necessary. 6. You MUST have a responsible adult to drive you home. 7. An adult MUST be with you the first 24 hours after you arrive home. 8. Bring a current list of your medications, and the last time and date medication taken. 9. Bring ID and current insurance cards. 10.Please wear clothes that are easy to get on and off and wear slip-on shoes.  Thank you for allowing [place] to care for you!   -- [facility] Invasive Cardiovascular services    Signed, [name], MD  02/03/2023 5:51 PM    [facility]

## 2023-02-04 LAB — CBC
Hematocrit: 47.7 % (ref 37.5–51.0)
Hemoglobin: 15.7 g/dL (ref 13.0–17.7)
MCH: 30.1 pg (ref 26.6–33.0)
MCHC: 32.9 g/dL (ref 31.5–35.7)
MCV: 92 fL (ref 79–97)
Platelets: 238 10*3/uL (ref 150–450)
RBC: 5.21 x10E6/uL (ref 4.14–5.80)
RDW: 11.6 % (ref 11.6–15.4)
WBC: 7.4 10*3/uL (ref 3.4–10.8)

## 2023-02-04 LAB — BASIC METABOLIC PANEL
BUN/Creatinine Ratio: 13 (ref 9–20)
BUN: 15 mg/dL (ref 6–24)
CO2: 25 mmol/L (ref 20–29)
Calcium: 9.3 mg/dL (ref 8.7–10.2)
Chloride: 101 mmol/L (ref 96–106)
Creatinine, Ser: 1.12 mg/dL (ref 0.76–1.27)
Glucose: 118 mg/dL — ABNORMAL HIGH (ref 70–99)
Potassium: 4.6 mmol/L (ref 3.5–5.2)
Sodium: 140 mmol/L (ref 134–144)
eGFR: 82 mL/min/{1.73_m2} (ref >59)

## 2023-02-06 ENCOUNTER — Telehealth: Payer: Self-pay | Admitting: *Deleted

## 2023-02-06 NOTE — Telephone Encounter (Addendum)
Cardiac Catheterization scheduled at [facility] for: Friday February 07, 2023 12 Noon Arrival time [facility] Main Entrance A at: 10 AM  Nothing to eat after midnight prior to procedure, clear liquids until 5 AM day of procedure.  Medication instructions: -Usual morning medications can be taken with sips of water including aspirin 81 mg.  Plan to go home the same day, you will only stay overnight if medically necessary.  You must have responsible adult to drive you home.  Someone must be with you the first 24 hours after you arrive home.  Reviewed procedure instructions with patient

## 2023-02-07 ENCOUNTER — Ambulatory Visit (HOSPITAL_COMMUNITY)
Admission: RE | Admit: 2023-02-07 | Discharge: 2023-02-07 | Disposition: A | Discharge status: Home / Self Care | Payer: 59 | Attending: Cardiovascular Disease | Admitting: Cardiovascular Disease

## 2023-02-07 ENCOUNTER — Encounter (HOSPITAL_COMMUNITY)
Admission: RE | Disposition: A | Discharge status: Home / Self Care | Payer: Self-pay | Source: Home / Self Care | Attending: Cardiovascular Disease

## 2023-02-07 ENCOUNTER — Other Ambulatory Visit: Payer: Self-pay

## 2023-02-07 DIAGNOSIS — Z955 Presence of coronary angioplasty implant and graft: Secondary | ICD-10-CM | POA: Insufficient documentation

## 2023-02-07 DIAGNOSIS — Z79899 Other long term (current) drug therapy: Secondary | ICD-10-CM | POA: Insufficient documentation

## 2023-02-07 DIAGNOSIS — I25119 Atherosclerotic heart disease of native coronary artery with unspecified angina pectoris: Secondary | ICD-10-CM | POA: Insufficient documentation

## 2023-02-07 DIAGNOSIS — I10 Essential (primary) hypertension: Secondary | ICD-10-CM | POA: Insufficient documentation

## 2023-02-07 DIAGNOSIS — E782 Mixed hyperlipidemia: Secondary | ICD-10-CM | POA: Diagnosis not present

## 2023-02-07 DIAGNOSIS — Z7982 Long term (current) use of aspirin: Secondary | ICD-10-CM | POA: Diagnosis not present

## 2023-02-07 DIAGNOSIS — I2584 Coronary atherosclerosis due to calcified coronary lesion: Secondary | ICD-10-CM | POA: Diagnosis not present

## 2023-02-07 HISTORY — PX: LEFT HEART CATH AND CORONARY ANGIOGRAPHY: CATH118249

## 2023-02-07 SURGERY — LEFT HEART CATH AND CORONARY ANGIOGRAPHY
Anesthesia: LOCAL

## 2023-02-07 MED ORDER — LIDOCAINE HCL (PF) 1 % IJ SOLN
INTRAMUSCULAR | Status: AC
  Filled 2023-02-07: qty 30

## 2023-02-07 MED ORDER — ASPIRIN 81 MG PO CHEW: 81.0000 mg | CHEWABLE_TABLET | ORAL | Status: DC

## 2023-02-07 MED ORDER — MIDAZOLAM HCL 2 MG/2ML IJ SOLN
INTRAMUSCULAR | Status: DC | PRN
  Administered 2023-02-07: 2 mg via INTRAVENOUS

## 2023-02-07 MED ORDER — SODIUM CHLORIDE 0.9 % WEIGHT BASED INFUSION
3.0000 mL/kg/h | INTRAVENOUS | Status: AC
Start: 2023-02-07 — End: 2023-02-07
  Administered 2023-02-07: 3 mL/kg/h via INTRAVENOUS

## 2023-02-07 MED ORDER — FENTANYL CITRATE (PF) 100 MCG/2ML IJ SOLN
INTRAMUSCULAR | Status: DC | PRN
  Administered 2023-02-07: 25 ug via INTRAVENOUS

## 2023-02-07 MED ORDER — HEPARIN (PORCINE) IN NACL 1000-0.9 UT/500ML-% IV SOLN
INTRAVENOUS | Status: DC | PRN
  Administered 2023-02-07 (×2): 500 mL via INTRA_ARTERIAL

## 2023-02-07 MED ORDER — LIDOCAINE HCL (PF) 1 % IJ SOLN
INTRAMUSCULAR | Status: DC | PRN
  Administered 2023-02-07: 2 mL

## 2023-02-07 MED ORDER — HEPARIN SODIUM (PORCINE) 1000 UNIT/ML IJ SOLN
INTRAMUSCULAR | Status: DC | PRN
  Administered 2023-02-07: 5000 [IU] via INTRAVENOUS

## 2023-02-07 MED ORDER — SODIUM CHLORIDE 0.9 % WEIGHT BASED INFUSION: 1.0000 mL/kg/h | INTRAVENOUS | Status: DC

## 2023-02-07 MED ORDER — FENTANYL CITRATE (PF) 100 MCG/2ML IJ SOLN
INTRAMUSCULAR | Status: AC
  Filled 2023-02-07: qty 2

## 2023-02-07 MED ORDER — HEPARIN SODIUM (PORCINE) 1000 UNIT/ML IJ SOLN
INTRAMUSCULAR | Status: AC
  Filled 2023-02-07: qty 10

## 2023-02-07 MED ORDER — IOHEXOL 350 MG/ML SOLN
INTRAVENOUS | Status: DC | PRN
  Administered 2023-02-07: 55 mL via INTRA_ARTERIAL

## 2023-02-07 MED ORDER — MIDAZOLAM HCL 2 MG/2ML IJ SOLN
INTRAMUSCULAR | Status: AC
  Filled 2023-02-07: qty 2

## 2023-02-07 MED ORDER — VERAPAMIL HCL 2.5 MG/ML IV SOLN
INTRAVENOUS | Status: DC | PRN
  Administered 2023-02-07: 10 mL via INTRA_ARTERIAL

## 2023-02-07 MED ORDER — VERAPAMIL HCL 2.5 MG/ML IV SOLN
INTRAVENOUS | Status: AC
  Filled 2023-02-07: qty 2

## 2023-02-07 SURGICAL SUPPLY — 7 items
CATH 5FR JL3.5 JR4 ANG PIG MP (CATHETERS) IMPLANT
DEVICE RAD COMP TR BAND LRG (VASCULAR PRODUCTS) IMPLANT
GLIDESHEATH SLEND SS 6F .021 (SHEATH) IMPLANT
GUIDEWIRE INQWIRE 1.5J.035X260 (WIRE) IMPLANT
INQWIRE 1.5J .035X260CM (WIRE) ×1
PACK CARDIAC CATHETERIZATION (CUSTOM PROCEDURE TRAY) ×1 IMPLANT
SET ATX-X65L (MISCELLANEOUS) IMPLANT

## 2023-02-07 NOTE — Interval H&P Note (Signed)
History and Physical Interval Note:  02/07/2023 1:11 PM  Brandon Kemp  has presented today for surgery, with the diagnosis of angina.  The various methods of treatment have been discussed with the patient and family. After consideration of risks, benefits and other options for treatment, the patient has consented to  Procedure(s): LEFT HEART CATH AND CORONARY ANGIOGRAPHY (N/A) as a surgical intervention.  The patient's history has been reviewed, patient examined, no change in status, stable for surgery.  I have reviewed the patient's chart and labs.  Questions were answered to the patient's satisfaction.     [name]

## 2023-02-07 NOTE — Discharge Instructions (Signed)
 Radial Site Care The following information offers guidance on how to care for yourself after your procedure. Your health care provider may also give you more specific instructions. If you have problems or questions, contact your health care provider. What can I expect after the procedure? After the procedure, it is common to have bruising and tenderness in the incision area. Follow these instructions at home: Incision site care  Follow instructions from your health care provider about how to take care of your incision site. Make sure you: Wash your hands with soap and water for at least 20 seconds before and after you change your bandage (dressing). If soap and water are not available, use hand sanitizer. Remove your dressing in 24 hours. Leave stitches (sutures), skin glue, or adhesive strips in place. These skin closures may need to stay in place for 2 weeks or longer. If adhesive strip edges start to loosen and curl up, you may trim the loose edges. Do not remove adhesive strips completely unless your health care provider tells you to do that. Do not take baths, swim, or use a hot tub for at least 1 week. You may shower 24 hours after the procedure or as told by your health care provider. Remove the dressing and gently wash the incision area with plain soap and water. Pat the area dry with a clean towel. Do not rub the site. That could cause bleeding. Do not apply powder or lotion to the site. Check your incision site every day for signs of infection. Check for: Redness, swelling, or pain. Fluid or blood. Warmth. Pus or a bad smell. Activity For 24 hours after the procedure, or as directed by your health care provider: Do not flex or bend the affected arm. Do not push or pull heavy objects with the affected arm. Do not operate machinery or power tools. Do not drive. You should not drive yourself home from the hospital or clinic if you go home during that time period. You may drive 24  hours after the procedure unless your health care provider tells you not to. Do not lift anything that is heavier than 10 lb (4.5 kg), or the limit that you are told, until your health care provider says that it is safe. Return to your normal activities as told by your health care provider. Ask your health care provider what activities are safe for you and when you can return to work. If you were given a sedative during the procedure, it can affect you for several hours. Do not drive or operate machinery until your health care provider says that it is safe. General instructions Take over-the-counter and prescription medicines only as told by your health care provider. If you will be going home right after the procedure, plan to have a responsible adult care for you for the time you are told. This is important. Keep all follow-up visits. This is important. Contact a health care provider if: You have a fever or chills. You have any of these signs of infection at your incision site: Redness, swelling, or pain. Fluid or blood. Warmth. Pus or a bad smell. Get help right away if: The incision area swells very fast. The incision area is bleeding, and the bleeding does not stop when you hold steady pressure on the area. Your arm or hand becomes pale, cool, tingly, or numb. These symptoms may represent a serious problem that is an emergency. Do not wait to see if the symptoms will go away. Get medical  help right away. Call your local emergency services (911 in the [place] Do not drive yourself to the hospital. Summary After the procedure, it is common to have bruising and tenderness at the incision site. Follow instructions from your health care provider about how to take care of your radial site incision. Check the incision every day for signs of infection. Do not lift anything that is heavier than 10 lb (4.5 kg), or the limit that you are told, until your health care provider says that it is  safe. Get help right away if the incision area swells very fast, you have bleeding at the incision site that will not stop, or your arm or hand becomes pale, cool, or numb. This information is not intended to replace advice given to you by your health care provider. Make sure you discuss any questions you have with your health care provider. Document Revised: 04/09/2020 Document Reviewed: 04/09/2020 [employer]vier Patient Education  2024 [employer].

## 2023-02-10 ENCOUNTER — Encounter (HOSPITAL_COMMUNITY): Payer: Self-pay | Admitting: Cardiovascular Disease

## 2023-03-25 ENCOUNTER — Other Ambulatory Visit: Payer: Self-pay | Admitting: Cardiovascular Disease

## 2023-06-20 ENCOUNTER — Ambulatory Visit: Payer: PRIVATE HEALTH INSURANCE | Attending: Cardiology

## 2023-09-29 ENCOUNTER — Encounter: Payer: Self-pay | Admitting: Cardiovascular Disease

## 2023-09-29 ENCOUNTER — Telehealth: Payer: Self-pay | Admitting: *Deleted

## 2023-09-29 NOTE — Progress Notes (Unsigned)
 Pt reached out with BP readings, remaining above goal of <140/90. We have doubled his telmisartan  to 80 mg daily. Recommend he stay on this higher dose of telmisartan  and add amlodipine  5 mg daily.   Thx

## 2023-09-29 NOTE — Telephone Encounter (Signed)
 Open error

## 2023-09-30 ENCOUNTER — Other Ambulatory Visit: Payer: Self-pay | Admitting: *Deleted

## 2023-09-30 DIAGNOSIS — I10 Essential (primary) hypertension: Secondary | ICD-10-CM

## 2023-09-30 DIAGNOSIS — R9431 Abnormal electrocardiogram [ECG] [EKG]: Secondary | ICD-10-CM

## 2023-09-30 DIAGNOSIS — Q221 Congenital pulmonary valve stenosis: Secondary | ICD-10-CM

## 2023-09-30 DIAGNOSIS — R931 Abnormal findings on diagnostic imaging of heart and coronary circulation: Secondary | ICD-10-CM

## 2023-09-30 MED ORDER — TELMISARTAN 80 MG PO TABS: 80.0000 mg | ORAL_TABLET | Freq: Every day | ORAL | 2 refills | Status: AC

## 2023-09-30 MED ORDER — AMLODIPINE BESYLATE 5 MG PO TABS: 5.0000 mg | ORAL_TABLET | Freq: Every day | ORAL | 6 refills | Status: AC

## 2023-09-30 NOTE — Progress Notes (Signed)
 See note below. Please send in Rx for amlodipine  5 mg daily and telmisartan  80 mg daily. Thank you

## 2023-09-30 NOTE — Progress Notes (Addendum)
 Spoke to pt.  Aware sending in updated Rx for Telmisartan  and new Rx for Amlodipine .  Advised to call office is SE occur after the new medication and/or BP is still elevated after making these medication changes.  Patient agreeable  to plan.  (Aware new Rx for Telmisartan  is 80 mg tablets)

## 2023-10-13 ENCOUNTER — Other Ambulatory Visit: Payer: Self-pay | Admitting: Cardiovascular Disease

## 2023-10-13 DIAGNOSIS — R931 Abnormal findings on diagnostic imaging of heart and coronary circulation: Secondary | ICD-10-CM

## 2023-10-13 DIAGNOSIS — I10 Essential (primary) hypertension: Secondary | ICD-10-CM

## 2023-10-13 DIAGNOSIS — Q221 Congenital pulmonary valve stenosis: Secondary | ICD-10-CM

## 2023-10-13 DIAGNOSIS — R9431 Abnormal electrocardiogram [ECG] [EKG]: Secondary | ICD-10-CM

## 2023-11-21 ENCOUNTER — Encounter: Payer: Self-pay | Admitting: Cardiovascular Disease

## 2023-11-21 ENCOUNTER — Ambulatory Visit: Attending: Cardiovascular Disease | Admitting: Cardiovascular Disease

## 2023-11-21 VITALS — BP 132/80 | HR 80 | Ht 71.0 in | Wt 241.8 lb

## 2023-11-21 DIAGNOSIS — I25119 Atherosclerotic heart disease of native coronary artery with unspecified angina pectoris: Secondary | ICD-10-CM | POA: Diagnosis not present

## 2023-11-21 DIAGNOSIS — Q221 Congenital pulmonary valve stenosis: Secondary | ICD-10-CM

## 2023-11-21 DIAGNOSIS — E785 Hyperlipidemia, unspecified: Secondary | ICD-10-CM

## 2023-11-21 DIAGNOSIS — I10 Essential (primary) hypertension: Secondary | ICD-10-CM

## 2023-11-21 NOTE — Patient Instructions (Signed)

## 2023-11-21 NOTE — Assessment & Plan Note (Signed)
 Most recent echo reviewed with normal RV function and normal RV size.  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

## 2023-11-21 NOTE — Progress Notes (Signed)
 Cardiology Office Note:    Date:  11/21/2023   ID:  Brandon Kemp, DOB 1975-06-27, MRN [id number]  PCP:  Rolinda Millman, MD   [facility] Providers Cardiologist:  Ozell Fell, MD     Referring MD: Rolinda Millman, MD   Chief Complaint  Patient presents with   Hypertension    History of Present Illness:    Brandon Kemp is a 48 y.o. male with a hx of coronary artery disease and hypertension, presenting for follow-up evaluation.  In 2023 the patient was found to have total occlusion of the proximal LAD and was treated with drug-eluting stent implantation.  Follow-up imaging with a nuclear stress test showed normal perfusion and normal LVEF of 55 to 65%.  He ultimately underwent repeat cardiac catheterization in December 2024 due to recurrent symptoms.  He was found to have patent coronary arteries with continued patency of the stented segment in the proximal LAD and wide patency of the left main, left circumflex, and right coronary arteries with mild diffuse plaquing but no significant obstruction noted.  LVEDP was normal.  The patient is here alone today.  He had some issues with hypertension and we adjusted his medications, now doing much better.  He is currently taking amlodipine  5 mg daily and telmisartan  80 mg daily.  Reports blood pressures are much better.  He is also been exercising and has lost some weight.  No chest pain, chest pressure, or shortness of breath.  He complains of some generalized fatigue, some days worse than others.  No edema, orthopnea, or PND.  Current Medications: Current Meds  Medication Sig   amLODipine  (NORVASC ) 5 MG tablet Take 1 tablet (5 mg total) by mouth daily.   aspirin  EC 81 MG tablet Take 81 mg by mouth daily. Swallow whole.   CALCIUM -VITAMIN D PO Take 1 tablet by mouth daily.   Coenzyme Q10 (COQ10) 100 MG CAPS Take 100 mg by mouth daily.   ezetimibe  (ZETIA ) 10 MG tablet TAKE 1 TABLET BY MOUTH EVERY DAY   Magnesium 500 MG TABS  Take 500 mg by mouth daily.   methylphenidate (RITALIN LA) 20 MG 24 hr capsule Take 20 mg by mouth daily.   Multiple Vitamins-Minerals (MULTIVITAMIN WITH MINERALS) tablet Take 1 tablet by mouth daily.   nitroGLYCERIN  (NITROSTAT ) 0.4 MG SL tablet Place 1 tablet (0.4 mg total) under the tongue every 5 (five) minutes as needed.   rosuvastatin  (CRESTOR ) 40 MG tablet TAKE 1 TABLET BY MOUTH EVERY DAY   sildenafil (REVATIO) 20 MG tablet Take 20 mg by mouth daily as needed (ED).   telmisartan  (MICARDIS ) 80 MG tablet Take 1 tablet (80 mg total) by mouth daily.     Allergies:   Patient has no known allergies.   ROS:   Please see the history of present illness.    All other systems reviewed and are negative.  EKGs/Labs/Other Studies Reviewed:    The following studies were reviewed today: Cardiac Studies & Procedures   ______________________________________________________________________________________________ CARDIAC CATHETERIZATION  CARDIAC CATHETERIZATION 02/07/2023  Conclusion   1st Mrg lesion is 30% stenosed.   Non-stenotic Prox LAD to Mid LAD lesion was previously treated.   LV end diastolic pressure is normal.  1.  Continued patency of the stented segment in the proximal LAD with no evidence of restenosis 2.  Patent left main, left circumflex, and RCA, with mild diffuse plaquing but no obstructive disease in those vessels 3.  Normal LVEDP  Recommendations: Reassuring findings.  Continue medical therapy.  Findings Coronary Findings Diagnostic  Dominance: Right  Left Main There is mild diffuse disease throughout the vessel. The left main has mild diffuse plaque with no significant stenosis.  Divides into the LAD and left circumflex.  Left Anterior Descending The LAD is patent.  The proximal LAD stent is patent with no evidence of restenosis.  The diagonal branches are all patent with no stenosis.  The LAD reaches the LV apex. Collaterals Dist LAD filled by collaterals from  RPDA.  Non-stenotic Prox LAD to Mid LAD lesion was previously treated. The lesion is calcified.  First Septal Branch  Second Septal Branch  Left Circumflex The circumflex is patent.  The vessel supplies an OM branch that has mild 30% stenosis unchanged from the previous study.  The AV circumflex after the OM is small in caliber.  First Obtuse Marginal Branch 1st Mrg lesion is 30% stenosed.  Right Coronary Artery Vessel is large. There is mild diffuse disease throughout the vessel. This is a large, dominant vessel.  There is mild nonobstructive plaquing present.  There is no significant stenosis throughout the RCA or its branch vessels.  The PDA and PLA branches are both widely patent with no stenosis.  Intervention  No interventions have been documented.   CARDIAC CATHETERIZATION  CARDIAC CATHETERIZATION 09/26/2021  Conclusion 1.  Severe single-vessel coronary artery disease with subtotal stenosis of the proximal LAD, successfully treated with PCI using IVUS guidance (3.5 x 34 mm Onyx frontier DES) 2.  Patent left main, left circumflex, and dominant RCA with mild diffuse plaquing but no significant stenoses  Recommend: Same-day PCI discharge protocol if criteria met.  Dual antiplatelet therapy with aspirin  and ticagrelor  x12 months  Findings Coronary Findings Diagnostic  Dominance: Right  Left Main The left main is patent with no stenosis.  The vessel bifurcates into the LAD and left circumflex.  Left Anterior Descending Collaterals Dist LAD filled by collaterals from RPDA.  Prox LAD to Mid LAD lesion is 99% stenosed. The lesion is calcified.  First Septal Branch Collaterals 1st Sept filled by collaterals from RPDA.  Second Septal Branch Collaterals 2nd Sept filled by collaterals from RPDA.  Left Circumflex The circumflex is patent.  The vessel has mild diffuse plaquing without any high-grade stenosis.  The first OM branch is large in caliber and divides into twin  vessels.  The AV circumflex is of medium caliber and supplies 2 small posterolateral branches.  First Obtuse Marginal Branch 1st Mrg lesion is 30% stenosed.  Right Coronary Artery Vessel is large. There is mild diffuse disease throughout the vessel. There is mild diffuse plaquing throughout the RCA.  The vessel is large, dominant, without significant stenosis.  The PDA and PLA branches are patent.  The LAD fills via right to left collaterals supplied through septal perforating branches  Intervention  Prox LAD to Mid LAD lesion Stent CATH VISTA GUIDE 6FR XBLAD3.5 guide catheter was inserted. Lesion crossed with guidewire using a WIRE COUGAR XT STRL 190CM. Pre-stent angioplasty was performed using a BALLN SAPPHIRE 2.5X15. Maximum pressure:  14 atm. A drug-eluting stent was successfully placed using a STENT ONYX FRONTIER 3.5X34. Post-stent angioplasty was performed using a BALL SAPPHIRE NC24 3.75X15. Maximum pressure:  18 atm. Post-Intervention Lesion Assessment The intervention was successful. Pre-interventional TIMI flow is 1. Post-intervention TIMI flow is 3. No complications occurred at this lesion. There is a 0% residual stenosis post intervention.   STRESS TESTS  MYOCARDIAL PERFUSION IMAGING 09/13/2022  Interpretation Summary  The study is normal. The study is low risk.   No ST deviation was noted.   Left ventricular function is normal. The left ventricular ejection fraction is normal (55-65%). End diastolic cavity size is normal.   Prior study not available for comparison.   ECHOCARDIOGRAM  ECHOCARDIOGRAM COMPLETE 02/20/2022  Narrative ECHOCARDIOGRAM REPORT    [name] Date of Exam: 02/20/2022 Medical Rec #:  [id number]         Height:       71.0 in Accession #:    [id number]        Weight:       240.2 lb Date of Birth:  Jul 09, 1975         BSA:          2.279 m Patient Age:    46 years          BP:           132/80 mmHg Patient Gender: M                  HR:           66 bpm. Exam Location:  [address]  Procedure: 2D Echo, Cardiac Doppler, Color Doppler and Strain Analysis  Indications:    I25.5 Ischemic cardiomyopathy  History:        Patient has prior history of Echocardiogram examinations, most recent 08/30/2021. CAD, Ischemic cardiomyopathy; Risk Factors:Hypertension, Dyslipidemia and Former Smoker.  Sonographer:    Elsie Bohr RDCS Referring Phys: [phone] Chipper Koudelka  IMPRESSIONS   1. Left ventricular ejection fraction, by estimation, is 50 to 55%. The left ventricle has low normal function. The left ventricle has no regional wall motion abnormalities. Left ventricular diastolic parameters were normal. The average left ventricular global longitudinal strain is -12.9 %. The global longitudinal strain is abnormal. 2. Right ventricular systolic function is normal. The right ventricular size is normal. 3. The mitral valve is normal in structure. No evidence of mitral valve regurgitation. No evidence of mitral stenosis. 4. The aortic valve is tricuspid. There is mild thickening of the aortic valve. Aortic valve regurgitation is not visualized. Aortic valve sclerosis is present, with no evidence of aortic valve stenosis. 5. The inferior vena cava is normal in size with greater than 50% respiratory variability, suggesting right atrial pressure of 3 mmHg.  FINDINGS Left Ventricle: Left ventricular ejection fraction, by estimation, is 50 to 55%. The left ventricle has low normal function. The left ventricle has no regional wall motion abnormalities. The average left ventricular global longitudinal strain is -12.9 %. The global longitudinal strain is abnormal. The left ventricular internal cavity size was normal in size. There is no left ventricular hypertrophy. Left ventricular diastolic parameters were normal. Normal left ventricular filling pressure.  Right Ventricle: The right ventricular size is normal. No increase in right  ventricular wall thickness. Right ventricular systolic function is normal.  Left Atrium: Left atrial size was normal in size.  Right Atrium: Right atrial size was normal in size.  Pericardium: There is no evidence of pericardial effusion.  Mitral Valve: The mitral valve is normal in structure. No evidence of mitral valve regurgitation. No evidence of mitral valve stenosis.  Tricuspid Valve: The tricuspid valve is normal in structure. Tricuspid valve regurgitation is not demonstrated. No evidence of tricuspid stenosis.  Aortic Valve: The aortic valve is tricuspid. There is mild thickening of the aortic valve. Aortic valve regurgitation is not visualized. Aortic valve sclerosis is  present, with no evidence of aortic valve stenosis.  Pulmonic Valve: The pulmonic valve was not well visualized. Pulmonic valve regurgitation is mild. No evidence of pulmonic stenosis.  Aorta: The aortic root is normal in size and structure.  Venous: The inferior vena cava is normal in size with greater than 50% respiratory variability, suggesting right atrial pressure of 3 mmHg.  IAS/Shunts: No atrial level shunt detected by color flow Doppler.   LEFT VENTRICLE PLAX 2D LVIDd:         4.60 cm   Diastology LVIDs:         3.45 cm   LV e' medial:    10.10 cm/s LV PW:         1.10 cm   LV E/e' medial:  7.4 LV IVS:        1.10 cm   LV e' lateral:   11.10 cm/s LVOT diam:     2.00 cm   LV E/e' lateral: 6.7 LV SV:         72 LV SV Index:   32        2D Longitudinal Strain LVOT Area:     3.14 cm  2D Strain GLS Avg:     -12.9 %   RIGHT VENTRICLE            IVC TAPSE (M-mode): 1.6 cm     IVC diam: 0.90 cm RVSP:           20.0 mmHg  LEFT ATRIUM             Index        RIGHT ATRIUM           Index LA diam:        3.30 cm 1.45 cm/m   RA Pressure: 3.00 mmHg LA Vol (A2C):   41.4 ml 18.16 ml/m  RA Area:     18.50 cm LA Vol (A4C):   51.4 ml 22.55 ml/m  RA Volume:   55.60 ml  24.39 ml/m LA Biplane Vol: 48.4 ml  21.24 ml/m AORTIC VALVE LVOT Vmax:   117.00 cm/s LVOT Vmean:  74.600 cm/s LVOT VTI:    0.230 m  AORTA Ao Root diam: 3.30 cm Ao Asc diam:  3.10 cm  MITRAL VALVE               TRICUSPID VALVE MV Area (PHT): 3.83 cm    TR Peak grad:   17.0 mmHg MV Decel Time: 198 msec    TR Vmax:        206.00 cm/s MV E velocity: 74.40 cm/s  Estimated RAP:  3.00 mmHg MV A velocity: 49.80 cm/s  RVSP:           20.0 mmHg MV E/A ratio:  1.49 SHUNTS Systemic VTI:  0.23 m Systemic Diam: 2.00 cm  Jerel Croitoru MD Electronically signed by Jerel Balding MD Signature Date/Time: 02/20/2022/12:33:14 PM    Final      CT SCANS  CT CARDIAC SCORING (SELF PAY ONLY) 08/30/2021  Addendum 08/30/2021 11:56 AM ADDENDUM REPORT: 08/30/2021 10:24  EXAM: OVER-READ INTERPRETATION  CT CHEST  The following report is an over-read performed by radiologist Dr. Jacob Kahnof [facility] on 08/30/2021. This over-read does not include interpretation of cardiac or coronary anatomy or pathology. The coronary calcium  score interpretation by the cardiologist is attached.  COMPARISON:  None.  FINDINGS: Vascular: No significant extracardiac vascular findings. Unchanged calcification of the pericardium overlying the right ventricular outflow tract.  Mediastinum/Nodes:  No lymphadenopathy.  Lungs/Pleura: No focal airspace disease. No suspicious pulmonary nodules.  Upper Abdomen: No acute abnormality.  Musculoskeletal: No acute osseous abnormality. No suspicious osseous lesion. Prior median sternotomy.  IMPRESSION: No acute or significant incidental extracardiac findings in the chest.   Electronically Signed By: Lang Sprinkles M.D. On: 08/30/2021 10:24  Narrative CLINICAL DATA:  Cardiovascular Disease Risk stratification  EXAM: Coronary Calcium  Score  TECHNIQUE: A gated, non-contrast computed tomography scan of the heart was performed using 3mm slice thickness. Axial images were analyzed on  a dedicated workstation. Calcium  scoring of the coronary arteries was performed using the Agatston method.  FINDINGS: Coronary Calcium  Score:  Left main: 83.8  Left anterior descending artery: 585  Left circumflex artery: 48.9  Right coronary artery: 149  Total: 867  Percentile: 99  Pericardium: Normal.  Ascending Aorta: Normal caliber.  Non-cardiac: See separate report from [facility].  IMPRESSION: Coronary calcium  score of 867. This was 77 percentile for age-, race-, and sex-matched controls.  RECOMMENDATIONS: Coronary artery calcium  (CAC) score is a strong predictor of incident coronary heart disease (CHD) and provides predictive information beyond traditional risk factors. CAC scoring is reasonable to use in the decision to withhold, postpone, or initiate statin therapy in intermediate-risk or selected borderline-risk asymptomatic adults (age 109-75 years and LDL-C >=70 to <190 mg/dL) who do not have diabetes or established atherosclerotic cardiovascular disease (ASCVD).* In intermediate-risk (10-year ASCVD risk >=7.5% to <20%) adults or selected borderline-risk (10-year ASCVD risk >=5% to <7.5%) adults in whom a CAC score is measured for the purpose of making a treatment decision the following recommendations have been made:  If CAC=0, it is reasonable to withhold statin therapy and reassess in 5 to 10 years, as long as higher risk conditions are absent (diabetes mellitus, family history of premature CHD in first degree relatives (males <55 years; females <65 years), cigarette smoking, or LDL >=190 mg/dL).  If CAC is 1 to 99, it is reasonable to initiate statin therapy for patients >=29 years of age.  If CAC is >=100 or >=75th percentile, it is reasonable to initiate statin therapy at any age.  Cardiology referral should be considered for patients with CAC scores >=400 or >=75th percentile.  *2018  AHA/ACC/AACVPR/AAPA/ABC/ACPM/ADA/AGS/APhA/ASPC/NLA/PCNA Guideline on the Management of Blood Cholesterol: A Report of the American College of Cardiology/American Heart Association Task Force on Clinical Practice Guidelines. J Am Coll Cardiol. 2019;73(24):3168-3209.  Redell Shallow, MD  Electronically Signed: By: Redell Shallow M.D. On: 08/30/2021 10:09   CT SCANS  CT CARDIAC SCORING (SELF PAY ONLY) 08/08/2014  Addendum 08/08/2014  5:18 PM ADDENDUM REPORT: 08/08/2014 17:16  CLINICAL DATA:  Risk stratification  EXAM: Coronary Calcium  Score  TECHNIQUE: The patient was scanned on a Siemens Sensation 16 slice scanner. Axial non-contrast 3mm slices were carried out through the heart. The data set was analyzed on a dedicated work station and scored using the Agatson method.  FINDINGS: Non-cardiac: No significant non cardiac findings on limited lung and soft tissue windows. See separate report from [facility].  Ascending Aorta:  3.2 cm  Pericardium: Some calcification of pericardium over the infundibulum of the RVOT  Coronary arteries: Multiple areas of calcification in the mid and distal LAD  IMPRESSION: Coronary calcium  score of 31. This was 96th percentile for age and sex matched control.  Maude Emmer   Electronically Signed By: Maude Emmer M.D. On: 08/08/2014 17:16  Narrative EXAM: OVER-READ INTERPRETATION  CT CHEST  The following report is an over-read performed by  radiologist Dr. Toribio Cove [facility] on 08/08/2014. This over-read does not include interpretation of cardiac or coronary anatomy or pathology. The coronary calcium  score interpretation by the cardiologist is attached.  COMPARISON:  No priors.  FINDINGS: Within the visualized portions of the thorax there are no suspicious appearing pulmonary nodules or masses, there is no acute consolidative airspace disease, no pleural effusion, no pneumothorax and no  lymphadenopathy. Some pericardial calcification is identified overlying the infundibulum. Visualized portions of the upper abdomen are unremarkable. There are no aggressive appearing lytic or blastic lesions noted in the visualized portions of the skeleton. Diminutive median sternotomy wires are noted, presumably from remote child with median sternotomy.  IMPRESSION: 1. No significant incidental noncardiac findings noted. 2. 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  Electronically Signed: By: Toribio Aye M.D. On: 08/08/2014 16:45     ______________________________________________________________________________________________      EKG:        Recent Labs: 12/17/2022: ALT 45 02/03/2023: BUN 15; Creatinine, Ser 1.12; Hemoglobin 15.7; Platelets 238; Potassium 4.6; Sodium 140  Recent Lipid Panel    Component Value Date/Time   CHOL 125 06/12/2022 0845   TRIG 107 06/12/2022 0845   HDL 39 (L) 06/12/2022 0845   CHOLHDL 3.2 06/12/2022 0845   CHOLHDL 5 09/09/2014 0902   VLDL 23.6 09/09/2014 0902   LDLCALC 66 06/12/2022 0845   LDLDIRECT 155.7 09/26/2011 1031     Risk Assessment/Calculations:                Physical Exam:    VS:  BP 132/80 (BP Location: Left Arm, Patient Position: Sitting, Cuff Size: Large)   Pulse 80   Ht 5' 11 (1.803 m)   Wt 241 lb 12.8 oz (109.7 kg)   SpO2 97%   BMI 33.72 kg/m     Wt Readings from Last 3 Encounters:  11/21/23 241 lb 12.8 oz (109.7 kg)  02/07/23 248 lb (112.5 kg)  02/03/23 251 lb 12.8 oz (114.2 kg)     GEN:  Well nourished, well developed in no acute distress HEENT: Normal NECK: No JVD; No carotid bruits LYMPHATICS: No lymphadenopathy CARDIAC: RRR, 2/6 systolic ejection murmur at the left sternal border RESPIRATORY:  Clear to auscultation without rales, wheezing or rhonchi  ABDOMEN: Soft, non-tender, non-distended MUSCULOSKELETAL:   No edema; No deformity  SKIN: Warm and dry NEUROLOGIC:  Alert and oriented x 3 PSYCHIATRIC:  Normal affect   Assessment & Plan Essential hypertension Blood pressure now controlled.  Continue amlodipine  and telmisartan .  Patient will continue to work on diet and exercise. 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 Coronary artery disease involving native coronary artery of native heart with angina pectoris (HCC) Stable on aspirin  81 mg daily.  Recent heart catheterization with stent patency and mild nonobstructive CAD.  Continue rosuvastatin  40 mg daily. Hyperlipidemia LDL goal <70 Last LDL 66.  Treated with Zetia  and rosuvastatin .  Continue current management.            Medication Adjustments/Labs and Tests Ordered: Current medicines are reviewed at length with the patient today.  Concerns regarding medicines are outlined above.  No orders of the defined types were placed in this encounter.  No orders of the defined types were placed in this encounter.   There are no Patient Instructions on file for this  visit.   Signed, Ozell Fell, MD  11/21/2023 5:06 PM    [facility]
